# Patient Record
Sex: Female | Born: 1970 | Race: Black or African American | Hispanic: No | Smoking: Never smoker
Health system: Southern US, Community
[De-identification: ages and names within clinical notes are randomized; demographics above are authoritative.]

## PROBLEM LIST (undated history)

## (undated) DIAGNOSIS — E611 Iron deficiency: Secondary | ICD-10-CM

## (undated) DIAGNOSIS — I10 Essential (primary) hypertension: Secondary | ICD-10-CM

## (undated) DIAGNOSIS — E669 Obesity, unspecified: Secondary | ICD-10-CM

## (undated) HISTORY — DX: Essential (primary) hypertension: I10

## (undated) HISTORY — PX: UTERINE FIBROID SURGERY: SHX826

## (undated) HISTORY — DX: Obesity, unspecified: E66.9

---

## 2019-05-31 DIAGNOSIS — I4891 Unspecified atrial fibrillation: Secondary | ICD-10-CM

## 2019-05-31 HISTORY — DX: Unspecified atrial fibrillation: I48.91

## 2019-12-19 ENCOUNTER — Other Ambulatory Visit: Payer: Self-pay

## 2019-12-19 ENCOUNTER — Encounter: Payer: Self-pay | Admitting: Internal Medicine

## 2019-12-19 ENCOUNTER — Ambulatory Visit: Payer: Self-pay | Admitting: Internal Medicine

## 2019-12-19 VITALS — BP 138/80 | HR 72 | Resp 14 | Ht 64.0 in | Wt 286.0 lb

## 2019-12-19 DIAGNOSIS — Z79899 Other long term (current) drug therapy: Secondary | ICD-10-CM

## 2019-12-19 DIAGNOSIS — Z6841 Body Mass Index (BMI) 40.0 and over, adult: Secondary | ICD-10-CM

## 2019-12-19 DIAGNOSIS — I480 Paroxysmal atrial fibrillation: Secondary | ICD-10-CM

## 2019-12-19 DIAGNOSIS — E669 Obesity, unspecified: Secondary | ICD-10-CM | POA: Insufficient documentation

## 2019-12-19 DIAGNOSIS — I10 Essential (primary) hypertension: Secondary | ICD-10-CM

## 2019-12-19 DIAGNOSIS — R609 Edema, unspecified: Secondary | ICD-10-CM

## 2019-12-19 MED ORDER — FLECAINIDE ACETATE 100 MG PO TABS: 100.0000 mg | ORAL_TABLET | Freq: Two times a day (BID) | ORAL | 3 refills | Status: DC

## 2019-12-19 MED ORDER — FUROSEMIDE 20 MG PO TABS: ORAL_TABLET | ORAL | 0 refills | Status: DC

## 2019-12-19 MED ORDER — METOPROLOL SUCCINATE ER 50 MG PO TB24: 50.0000 mg | ORAL_TABLET | Freq: Every day | ORAL | 3 refills | Status: AC

## 2019-12-19 NOTE — Progress Notes (Addendum)
Subjective:    Patient ID: Natalie Manning, female   DOB: Jan 12, 1971, 49 y.o.   MRN: [id number]   HPI   Here to establish  Moved here in November from [place].   She is needing med refilled.    1. Reportedly one episode of paroxysmal atrial fibrillation with rapid ventricular rate:  Occurred December 2019.  States taking Flecainide for this.  She states her work up was normal--they never found a reason for her atrial fib.  She thought they would ultimately take her off the Flecainide, but then COVID hit and she was never able to get back .   Apparently, she was offered ablation, but did not understand why she would want that done for a one time event.   May have had 6 follow ups total.  2.  Hypertension:  Diagnosed years prior to afib, but was able to come off medication at one point.  She redeveloped elevated bp and was placed back on medication.    3.  Applying for disability.  Also applying again for Medicaid.    4.  Bilateral ankle and foot swelling for about 2 weeks, then went down.  Started back up 3 days ago.  Sits with her legs dependent when sitting inside.  Does elevate feet when outside.   Eats a lot of carbs and pretzels. No chest pain or dyspnea.     Current Meds  Medication Sig  . aspirin 325 MG tablet Take 325 mg by mouth daily.  . flecainide (TAMBOCOR) 100 MG tablet Take 100 mg by mouth 2 (two) times daily.  . metoprolol succinate (TOPROL-XL) 50 MG 24 hr tablet Take 50 mg by mouth daily. Take with or immediately following a meal.   No Known Allergies   Past Medical History:  Diagnosis Date  . Atrial fibrillation ([place]) 05/2019  . Hypertension   . Iron deficiency    a. pt recalls previously having to have iron transfusions.  . Obesity     Past Surgical History:  Procedure Laterality Date  . UTERINE FIBROID SURGERY      Family History  Problem Relation Age of Onset  . Diabetes Mother   . Hypertension Mother   . Hypertension Sister   .  Obesity Sister        had gastric sleeve surgery.  Marland Kitchen CAD Neg Hx     Social History   Socioeconomic History  . Marital status: Legally Separated    Spouse name: Not on file  . Number of children: 1  . Years of education: Not on file  . Highest education level: Not on file  Occupational History  . Not on file  Tobacco Use  . Smoking status: Never Smoker  . Smokeless tobacco: Never Used  Vaping Use  . Vaping Use: Never used  Substance and Sexual Activity  . Alcohol use: Never  . Drug use: Never  . Sexual activity: Yes    Birth control/protection: None  Other Topics Concern  . Not on file  Social History Narrative   Moved to [place] in Nov 2020 for a job transfer with [employer], then they backed out of the transfer deal.  She is taking a year or two off to decide what to do.   Lives here mainly by herself, but son joins her at times.   Social Determinants of Health   Financial Resource Strain: Medium Risk  . Difficulty of Paying Living Expenses: Somewhat hard  Food Insecurity: No Food Insecurity  .  Worried About Programme researcher, broadcasting/film/video in the Last Year: Never true  . Ran Out of Food in the Last Year: Never true  Transportation Needs: No Transportation Needs  . Lack of Transportation (Medical): No  . Lack of Transportation (Non-Medical): No  Physical Activity:   . Days of Exercise per Week:   . Minutes of Exercise per Session:   Stress: No Stress Concern Present  . Feeling of Stress : Not at all  Social Connections: Socially Isolated  . Frequency of Communication with Friends and Family: Once a week  . Frequency of Social Gatherings with Friends and Family: Never  . Attends Religious Services: Never  . Active Member of Clubs or Organizations: No  . Attends Banker Meetings: Never  . Marital Status: Separated  Intimate Partner Violence: Not At Risk  . Fear of Current or Ex-Partner: No  . Emotionally Abused: No  . Physically Abused: No  . Sexually Abused: No        Review of Systems    Objective:   BP 138/80 (BP Location: Left Arm, Patient Position: Sitting, Cuff Size: Large)   Pulse 72   Resp 14   Ht 5\' 4"  (1.626 m)   Wt 286 lb (129.7 kg)   LMP 11/14/2019   BMI 49.09 kg/m   Physical Exam  NAD HEENT:  PERRL, EOMI, TMs pearly gray. Neck:  Supple, No adenopathy, no thyromegaly Chest:  CTA CV:  RRR with normal S1 and S2, No S3, S4 or murmur.  No carotid bruit.  Carotid, radial, DP and PT pulses normal and equal.  No JVD Abd:  S, NT, No HSM or mass, + BS LE:  Trace edema of feet/ankles.   Assessment & Plan  1.  History of Paroxysmal Atrial Fibrillation:  On Flecainide for this per patient.  Suspect more to her rhythm issues than just a one time episode of afib, but with her move and COVID 19 pandemic limiting follow up, perhaps she is correct.  Sending for records.   Referral to Cardiology for their opinion on this and long term use of Flecainide.  2.  Hypertension:  Fair control with Metoprolol XL.  3.  Mild peripheral edema:  Encouraged decrease in sodium intake and how to calculate.  Elevate legs when sitting and recline.  Discussed weight loss as well.  Furosemide 20 mg daily for 3 days, then only as needed.  Check CBC, CMP.  4.  Obesity:  Discussed lifestyle changes.  5.  Needs utility bill support:  Currently receiving some help with [facility].  Information for 11/16/2019 for [employer] support.  6.  Encouraged COVID vaccination.

## 2019-12-19 NOTE — Patient Instructions (Addendum)
[employer]  . Address: [facility], [address] [place], [place], [place] [address]  . Phone: [phone]  Hours: Open  Closes 4:30   Drink a glass of water before every meal Drink 6-8 glasses of water daily Eat three meals daily Eat a protein and healthy fat with every meal (eggs,fish, chicken, [place] and limit red meats) Eat 5 servings of vegetables daily, mix the colors Eat 2 servings of fruit daily with skin, if skin is edible Use smaller plates Put food/utensils down as you chew and swallow each bite Eat at a table with friends/family at least once daily, no TV Do not eat in front of the TV  Recent studies show that people who consume all of their calories in a 12 hour period lose weight more efficiently.  For example, if you eat your first meal at 7:00 a.m., your last meal of the day should be completed by 7:00 p.m.

## 2019-12-19 NOTE — Progress Notes (Signed)
Social worker met with new patient who is scheduled with Dr. Amil Amen for medical visit. Social worker completed New Patient Questionnaire which included completion of housing, intimate partner violence, transportation needs, stress, Emergency planning/management officer strain, food insecurity and screeners. Social History   Socioeconomic History  . Marital status: Legally Separated    Spouse name: Not on file  . Number of children: 1  . Years of education: Not on file  . Highest education level: Not on file  Occupational History  . Not on file  Tobacco Use  . Smoking status: Never Smoker  . Smokeless tobacco: Never Used  Vaping Use  . Vaping Use: Never used  Substance and Sexual Activity  . Alcohol use: Never  . Drug use: Never  . Sexual activity: Yes    Birth control/protection: None  Other Topics Concern  . Not on file  Social History Narrative   Moved to [place] in Nov 2020 for a job transfer with [employer], then they backed out of the transfer deal.  She is taking a year or two off to decide what to do.   Lives here mainly by herself, but son joins her at times.   Social Determinants of Health   Financial Resource Strain: Medium Risk  . Difficulty of Paying Living Expenses: Somewhat hard  Food Insecurity: No Food Insecurity  . Worried About Charity fundraiser in the Last Year: Never true  . Ran Out of Food in the Last Year: Never true  Transportation Needs: No Transportation Needs  . Lack of Transportation (Medical): No  . Lack of Transportation (Non-Medical): No  Stress: No Stress Concern Present  . Feeling of Stress : Not at all  Social Connections: Socially Isolated  . Frequency of Communication with Friends and Family: Once a week  . Frequency of Social Gatherings with Friends and Family: Never  . Attends Religious Services: Never  . Active Member of Clubs or Organizations: No  . Attends Archivist Meetings: Never  . Marital Status: Separated       GAD 7 :  Generalized Anxiety Score 12/19/2019  Nervous, Anxious, on Edge 0  Control/stop worrying 0  Worry too much - different things 0  Trouble relaxing 0  Restless 0  Easily annoyed or irritable 0  Afraid - awful might happen 0  Total GAD 7 Score 0  Anxiety Difficulty Not difficult at all     Based on presentation none at this time. Patient would like information to help with water bill although it is up to date but would like assistance.   Social worker provided emergency crisis flyer with crisis providers if ever needed.

## 2020-01-11 ENCOUNTER — Ambulatory Visit: Payer: Self-pay | Admitting: Cardiology

## 2020-01-11 DIAGNOSIS — D509 Iron deficiency anemia, unspecified: Secondary | ICD-10-CM | POA: Diagnosis present

## 2020-01-11 DIAGNOSIS — Z20822 Contact with and (suspected) exposure to covid-19: Secondary | ICD-10-CM | POA: Diagnosis present

## 2020-01-11 DIAGNOSIS — R1900 Intra-abdominal and pelvic swelling, mass and lump, unspecified site: Secondary | ICD-10-CM | POA: Diagnosis present

## 2020-01-11 DIAGNOSIS — J9601 Acute respiratory failure with hypoxia: Secondary | ICD-10-CM | POA: Diagnosis present

## 2020-01-11 DIAGNOSIS — I10 Essential (primary) hypertension: Secondary | ICD-10-CM | POA: Diagnosis present

## 2020-01-11 DIAGNOSIS — Z79899 Other long term (current) drug therapy: Secondary | ICD-10-CM

## 2020-01-11 DIAGNOSIS — Z6841 Body Mass Index (BMI) 40.0 and over, adult: Secondary | ICD-10-CM

## 2020-01-11 DIAGNOSIS — J90 Pleural effusion, not elsewhere classified: Principal | ICD-10-CM | POA: Diagnosis present

## 2020-01-11 DIAGNOSIS — E669 Obesity, unspecified: Secondary | ICD-10-CM | POA: Diagnosis present

## 2020-01-11 DIAGNOSIS — Z8249 Family history of ischemic heart disease and other diseases of the circulatory system: Secondary | ICD-10-CM

## 2020-01-11 DIAGNOSIS — I4891 Unspecified atrial fibrillation: Secondary | ICD-10-CM | POA: Diagnosis present

## 2020-01-11 DIAGNOSIS — R600 Localized edema: Secondary | ICD-10-CM | POA: Diagnosis present

## 2020-01-11 DIAGNOSIS — Z7982 Long term (current) use of aspirin: Secondary | ICD-10-CM

## 2020-01-11 MED ORDER — SODIUM CHLORIDE 0.9% FLUSH: 3.0000 mL | Freq: Once | INTRAVENOUS | Status: DC

## 2020-01-12 ENCOUNTER — Observation Stay (HOSPITAL_BASED_OUTPATIENT_CLINIC_OR_DEPARTMENT_OTHER): Payer: Self-pay

## 2020-01-12 ENCOUNTER — Other Ambulatory Visit: Payer: Self-pay | Admitting: Acute Care

## 2020-01-12 ENCOUNTER — Observation Stay (HOSPITAL_COMMUNITY): Payer: Self-pay

## 2020-01-12 ENCOUNTER — Encounter (HOSPITAL_COMMUNITY): Payer: Self-pay | Admitting: Emergency Medicine

## 2020-01-12 ENCOUNTER — Emergency Department (HOSPITAL_COMMUNITY): Payer: Self-pay

## 2020-01-12 ENCOUNTER — Other Ambulatory Visit: Payer: Self-pay

## 2020-01-12 ENCOUNTER — Inpatient Hospital Stay (HOSPITAL_COMMUNITY)
Admission: EM | Admit: 2020-01-12 | Discharge: 2020-01-13 | DRG: 186 | Disposition: A | Discharge status: Home / Self Care | Payer: Self-pay | Attending: Internal Medicine | Admitting: Internal Medicine

## 2020-01-12 DIAGNOSIS — Z9889 Other specified postprocedural states: Secondary | ICD-10-CM

## 2020-01-12 DIAGNOSIS — D649 Anemia, unspecified: Secondary | ICD-10-CM | POA: Diagnosis present

## 2020-01-12 DIAGNOSIS — J9601 Acute respiratory failure with hypoxia: Secondary | ICD-10-CM

## 2020-01-12 DIAGNOSIS — I480 Paroxysmal atrial fibrillation: Secondary | ICD-10-CM

## 2020-01-12 DIAGNOSIS — I4891 Unspecified atrial fibrillation: Secondary | ICD-10-CM | POA: Diagnosis present

## 2020-01-12 DIAGNOSIS — J90 Pleural effusion, not elsewhere classified: Principal | ICD-10-CM

## 2020-01-12 DIAGNOSIS — M7989 Other specified soft tissue disorders: Secondary | ICD-10-CM

## 2020-01-12 DIAGNOSIS — R110 Nausea: Secondary | ICD-10-CM

## 2020-01-12 DIAGNOSIS — R778 Other specified abnormalities of plasma proteins: Secondary | ICD-10-CM

## 2020-01-12 DIAGNOSIS — R0602 Shortness of breath: Secondary | ICD-10-CM

## 2020-01-12 HISTORY — DX: Iron deficiency: E61.1

## 2020-01-12 LAB — BASIC METABOLIC PANEL
Anion gap: 12 (ref 5–15)
BUN: 16 mg/dL (ref 6–20)
CO2: 23 mmol/L (ref 22–32)
Calcium: 9.3 mg/dL (ref 8.9–10.3)
Chloride: 105 mmol/L (ref 98–111)
Creatinine, Ser: 0.89 mg/dL (ref 0.44–1.00)
GFR calc Af Amer: >60 mL/min (ref >60)
GFR calc non Af Amer: >60 mL/min (ref >60)
Glucose, Bld: 113 mg/dL — ABNORMAL HIGH (ref 70–99)
Potassium: 3.7 mmol/L (ref 3.5–5.1)
Sodium: 140 mmol/L (ref 135–145)

## 2020-01-12 LAB — HIV ANTIBODY (ROUTINE TESTING W REFLEX): HIV Screen 4th Generation wRfx: NONREACTIVE

## 2020-01-12 LAB — HEPATIC FUNCTION PANEL
ALT: 14 U/L (ref 0–44)
AST: 21 U/L (ref 15–41)
Albumin: 3.9 g/dL (ref 3.5–5.0)
Alkaline Phosphatase: 46 U/L (ref 38–126)
Bilirubin, Direct: <0.1 mg/dL (ref 0.0–0.2)
Total Bilirubin: 0.5 mg/dL (ref 0.3–1.2)
Total Protein: 8 g/dL (ref 6.5–8.1)

## 2020-01-12 LAB — LACTATE DEHYDROGENASE: LDH: 309 U/L — ABNORMAL HIGH (ref 98–192)

## 2020-01-12 LAB — PROTEIN, TOTAL: Total Protein: 8.3 g/dL — ABNORMAL HIGH (ref 6.5–8.1)

## 2020-01-12 LAB — BODY FLUID CELL COUNT WITH DIFFERENTIAL
Lymphs, Fluid: 79 %
Monocyte-Macrophage-Serous Fluid: 10 % — ABNORMAL LOW (ref 50–90)
Neutrophil Count, Fluid: 11 % (ref 0–25)
Total Nucleated Cell Count, Fluid: 5957 cu mm — ABNORMAL HIGH (ref 0–1000)

## 2020-01-12 LAB — URINALYSIS, ROUTINE W REFLEX MICROSCOPIC
Bilirubin Urine: NEGATIVE
Glucose, UA: NEGATIVE mg/dL
Ketones, ur: NEGATIVE mg/dL
Leukocytes,Ua: NEGATIVE
Nitrite: NEGATIVE
Protein, ur: NEGATIVE mg/dL
Specific Gravity, Urine: 1.01 (ref 1.005–1.030)
pH: 5 (ref 5.0–8.0)

## 2020-01-12 LAB — IRON AND TIBC
Iron: 16 ug/dL — ABNORMAL LOW (ref 28–170)
Saturation Ratios: 4 % — ABNORMAL LOW (ref 10.4–31.8)
TIBC: 425 ug/dL (ref 250–450)
UIBC: 409 ug/dL

## 2020-01-12 LAB — CBC
HCT: 34.4 % — ABNORMAL LOW (ref 36.0–46.0)
Hemoglobin: 10.1 g/dL — ABNORMAL LOW (ref 12.0–15.0)
MCH: 22 pg — ABNORMAL LOW (ref 26.0–34.0)
MCHC: 29.4 g/dL — ABNORMAL LOW (ref 30.0–36.0)
MCV: 74.9 fL — ABNORMAL LOW (ref 80.0–100.0)
Platelets: 224 10*3/uL (ref 150–400)
RBC: 4.59 MIL/uL (ref 3.87–5.11)
RDW: 20 % — ABNORMAL HIGH (ref 11.5–15.5)
WBC: 12 10*3/uL — ABNORMAL HIGH (ref 4.0–10.5)
nRBC: 0 % (ref 0.0–0.2)

## 2020-01-12 LAB — LIPASE, BLOOD: Lipase: 30 U/L (ref 11–51)

## 2020-01-12 LAB — ECHOCARDIOGRAM COMPLETE

## 2020-01-12 LAB — I-STAT BETA HCG BLOOD, ED (NOT ORDERABLE): I-stat hCG, quantitative: <5 m[IU]/mL (ref <5)

## 2020-01-12 LAB — PROTIME-INR
INR: 1.5 — ABNORMAL HIGH (ref 0.8–1.2)
Prothrombin Time: 17.3 seconds — ABNORMAL HIGH (ref 11.4–15.2)

## 2020-01-12 LAB — PROCALCITONIN: Procalcitonin: <0.1 ng/mL

## 2020-01-12 LAB — TROPONIN I (HIGH SENSITIVITY)
Troponin I (High Sensitivity): 147 ng/L (ref <18)
Troponin I (High Sensitivity): 168 ng/L (ref <18)
Troponin I (High Sensitivity): 231 ng/L (ref <18)

## 2020-01-12 LAB — LACTATE DEHYDROGENASE, PLEURAL OR PERITONEAL FLUID: LD, Fluid: 1914 U/L — ABNORMAL HIGH (ref 3–23)

## 2020-01-12 LAB — RETICULOCYTES
Immature Retic Fract: 31.3 % — ABNORMAL HIGH (ref 2.3–15.9)
RBC.: 4.46 MIL/uL (ref 3.87–5.11)
Retic Count, Absolute: 86.5 10*3/uL (ref 19.0–186.0)
Retic Ct Pct: 1.9 % (ref 0.4–3.1)

## 2020-01-12 LAB — VITAMIN B12: Vitamin B-12: 203 pg/mL (ref 180–914)

## 2020-01-12 LAB — BRAIN NATRIURETIC PEPTIDE: B Natriuretic Peptide: 47.3 pg/mL (ref 0.0–100.0)

## 2020-01-12 LAB — FERRITIN: Ferritin: 13 ng/mL (ref 11–307)

## 2020-01-12 LAB — FOLATE: Folate: 10.9 ng/mL (ref >5.9)

## 2020-01-12 LAB — MAGNESIUM: Magnesium: 2.1 mg/dL (ref 1.7–2.4)

## 2020-01-12 LAB — SARS CORONAVIRUS 2 BY RT PCR (HOSPITAL ORDER, PERFORMED IN ~~LOC~~ HOSPITAL LAB): SARS Coronavirus 2: NEGATIVE

## 2020-01-12 LAB — PROTEIN, PLEURAL OR PERITONEAL FLUID: Total protein, fluid: 7 g/dL

## 2020-01-12 LAB — TSH: TSH: 2.483 u[IU]/mL (ref 0.350–4.500)

## 2020-01-12 LAB — CHOLESTEROL, TOTAL: Cholesterol: 200 mg/dL (ref 0–200)

## 2020-01-12 MED ORDER — SODIUM CHLORIDE (PF) 0.9 % IJ SOLN
INTRAMUSCULAR | Status: AC
  Filled 2020-01-12: qty 50

## 2020-01-12 MED ORDER — SODIUM CHLORIDE 0.9 % IV SOLN: Freq: Once | INTRAVENOUS | Status: DC

## 2020-01-12 MED ORDER — ASPIRIN 325 MG PO TABS
325.0000 mg | ORAL_TABLET | Freq: Every day | ORAL | Status: DC
  Administered 2020-01-12 – 2020-01-13 (×2): 325 mg via ORAL
  Filled 2020-01-12 (×2): qty 1

## 2020-01-12 MED ORDER — ONDANSETRON HCL 4 MG PO TABS: 4.0000 mg | ORAL_TABLET | Freq: Four times a day (QID) | ORAL | Status: DC | PRN

## 2020-01-12 MED ORDER — IOHEXOL 350 MG/ML SOLN
100.0000 mL | Freq: Once | INTRAVENOUS | Status: AC | PRN
  Administered 2020-01-12: 100 mL via INTRAVENOUS

## 2020-01-12 MED ORDER — METOPROLOL SUCCINATE ER 50 MG PO TB24
50.0000 mg | ORAL_TABLET | Freq: Every day | ORAL | Status: DC
  Administered 2020-01-12 – 2020-01-13 (×2): 50 mg via ORAL
  Filled 2020-01-12 (×2): qty 1

## 2020-01-12 MED ORDER — FUROSEMIDE 10 MG/ML IJ SOLN
40.0000 mg | Freq: Two times a day (BID) | INTRAMUSCULAR | Status: DC
  Administered 2020-01-12 – 2020-01-13 (×2): 40 mg via INTRAVENOUS
  Filled 2020-01-12 (×3): qty 4

## 2020-01-12 MED ORDER — ONDANSETRON HCL 4 MG/2ML IJ SOLN: 4.0000 mg | Freq: Four times a day (QID) | INTRAMUSCULAR | Status: DC | PRN

## 2020-01-12 MED ORDER — ACETAMINOPHEN 325 MG PO TABS
650.0000 mg | ORAL_TABLET | Freq: Four times a day (QID) | ORAL | Status: DC | PRN
  Administered 2020-01-12: 650 mg via ORAL
  Filled 2020-01-12: qty 2

## 2020-01-12 MED ORDER — FLECAINIDE ACETATE 100 MG PO TABS
100.0000 mg | ORAL_TABLET | Freq: Two times a day (BID) | ORAL | Status: DC
  Filled 2020-01-12: qty 1

## 2020-01-12 MED ORDER — FUROSEMIDE 10 MG/ML IJ SOLN: 40.0000 mg | Freq: Four times a day (QID) | INTRAMUSCULAR | Status: DC

## 2020-01-12 NOTE — ED Triage Notes (Signed)
Patient had second covid shot on Monday. Patient states she has been throwing up, diarrhea, and a severe headache.

## 2020-01-12 NOTE — Progress Notes (Signed)
Left lower extremity venous duplex completed. Refer to "CV Proc" under chart review to view preliminary results.  01/12/2020 4:27 PM Eula Fried., MHA, RVT, RDCS, RDMS

## 2020-01-12 NOTE — Consult Note (Signed)
NAME:  Natalie Manning, MRN:  [id number], DOB:  1971-04-25, LOS: 0 ADMISSION DATE:  01/12/2020, CONSULTATION DATE:  7/15 REFERRING MD:  Toniann Fail, CHIEF COMPLAINT:  Acute respiratory failure and pleural effusion   Brief History   49 year old female admitted 7/15 w/ cc: shortness of breath. Initially being worked up for possible PE given L>R LE edema. CT was neg for PE BUT did show large right pleural effusion w/ associated consolidative changes. Was also hypoxic so PCCM asked to assist w/ further evaluation.   History of present illness   49 year old female patient admitted on 7/15 with chief complaint of shortness of breath and nausea. History of atrial fibrillation diagnosed back in 2019.  Had been seen recently about 2 weeks prior to presentation by her primary care provider with chief complaint of bilateral lower extremity edema for which she was started on Lasix.  Of note she had had her COVID-19 vaccination on 7/11 this was subsequently followed by nausea, vomiting, and diarrhea which subsided the next day.  She presented to the emergency room with worsening shortness of breath complaint.  Denied fever, chills, chest pain, abdominal pain.  Revealed lower extremity edema had improved on the right side but not left. In the emergency room BNP was negative.  Had mild troponin elevation.  CT chest was obtained this was negative for central pulmonary emboli but did show right-sided pleural effusion.  Raised ? Of small LUL PE. She was hypoxic.  Ultrasounds were ordered to evaluate lower extremity edema, echocardiogram was ordered, cardiology consulted.  Pulmonary has been asked to evaluate for dyspnea and new right pleural effusion.  Past Medical History  Atrial fibrillation on flecainide and metoprolol, CHA2DS2-VASc score 2.  On aspirin Recent lower extremity edema. Hypertension Obesity  Significant Hospital Events   7/15 admitted  Consults:  pulm  Procedures:  Right diagnostic and  therapeutic thora 7/15 (pending)  Significant Diagnostic Tests:  CT chest 7/15: 1. Very limited study due to respiratory motion artifact. No large central pulmonary artery embolus. Artifact versus small nonocclusive age indeterminate peripheral pulmonary artery emboli. No CT evidence of right heart straining. 2. Large right pleural effusion with complete consolidative changes of the right lower lobe which may represent atelectasis or infiltrate. Underlying mass is not excluded. 3. Diffuse interstitial prominence may represent edema. 4. Mildly enlarged right cardiophrenic lymph nodes, likely reactive. ECHO 7/15>>> LE [place] 7/15>>> Micro Data:  Pleural fluid 7/15>>>  Antimicrobials:    Interim history/subjective:  Feels better   Objective   Blood pressure (Abnormal) 146/83, pulse 90, temperature 98.7 F (37.1 C), temperature source Oral, resp. rate 18, height 5\' 6"  (1.676 m), weight 130.4 kg, SpO2 98 %.       No intake or output data in the 24 hours ending 01/12/20 1035 Filed Weights   01/11/20 2337 01/12/20 1003  Weight: 131.5 kg 130.4 kg    Examination: General: pleasant 49 year old female resting in chair HENT: NCAT MMM No JVD Lungs: decreased on right no accessory use on room air  Cardiovascular: RRR Abdomen: soft not tender  Extremities: warm and dry LLE a little more swollen Neuro: awake and oriented  GU: voids   Resolved Hospital Problem list     Assessment & Plan:  Dyspnea  Large right pleural effusion Right lower lobe atelectasis  LE swelling Atrial fib Pelvic mass  Multifactorial dyspnea Multifactorial: large right effusion w/ associated consolidated RLL (favor atelectasis but mass not excluded). Also has element of what looks like  pulmonary edema.  -CT imaging raising concern for possible filling deficit in LUL not clear if this is motion artifact of small emboli. If it is emboli it is not why she is hypoxic or SOB. Still however reasonable to repeat imaging  as has had sig travel history and swelling in LEs started after last trip to [facility] about 3 weeks ago.  -I agree Not clear if there is mass in this area of consolidation. In setting of also having large pelvic mass wonder about malignancy.  Plan Will go ahead and do therapeutic and diagnostic thora today. Send for infection and malignancy evaluation Await echo and LE [place] (r/o DVT) Hold off on systemic ac from PE stand-point for now (will ask Dr Wynona Neat to review films).  If she has to go for CT of abd/pelvis we could repeat CT (angio) imaging of chest s/p thora as well to better evaluate the RLL consolidation and r/o mass once fluid no longer compressing it and also try to get a better quality film to r/o PE.      Best practice:  Per primary   Labs   CBC: Recent Labs  Lab 01/11/20 2354  WBC 12.0*  HGB 10.1*  HCT 34.4*  MCV 74.9*  PLT 224    Basic Metabolic Panel: Recent Labs  Lab 01/11/20 2354 01/12/20 0625  NA 140  --   K 3.7  --   CL 105  --   CO2 23  --   GLUCOSE 113*  --   BUN 16  --   CREATININE 0.89  --   CALCIUM 9.3  --   MG  --  2.1   GFR: Estimated Creatinine Clearance: 103.5 mL/min (by C-G formula based on SCr of 0.89 mg/dL). Recent Labs  Lab 01/11/20 2354 01/12/20 0625  PROCALCITON  --  <0.10  WBC 12.0*  --     Liver Function Tests: Recent Labs  Lab 01/12/20 0625  AST 21  ALT 14  ALKPHOS 46  BILITOT 0.5  PROT 8.0  ALBUMIN 3.9   Recent Labs  Lab 01/11/20 2354  LIPASE 30   No results for input(s): AMMONIA in the last 168 hours.  ABG No results found for: PHART, PCO2ART, PO2ART, HCO3, TCO2, ACIDBASEDEF, O2SAT   Coagulation Profile: Recent Labs  Lab 01/12/20 0625  INR 1.5*    Cardiac Enzymes: No results for input(s): CKTOTAL, CKMB, CKMBINDEX, TROPONINI in the last 168 hours.  HbA1C: No results found for: HGBA1C  CBG: No results for input(s): GLUCAP in the last 168 hours.  Review of Systems:   Review of Systems    Constitutional: Positive for fever. Negative for chills and malaise/fatigue.  HENT: Negative.   Eyes: Negative.   Respiratory: Positive for cough and shortness of breath.   Cardiovascular: Positive for leg swelling.  Gastrointestinal: Negative.   Genitourinary: Negative.   Musculoskeletal: Negative.   Skin: Negative.   Neurological: Negative.   Endo/Heme/Allergies: Negative.   Psychiatric/Behavioral: Negative.      Past Medical History  She,  has a past medical history of Atrial fibrillation ([facility]) (05/2019), Hypertension, Iron deficiency, and Obesity.   Surgical History    Past Surgical History:  Procedure Laterality Date   UTERINE FIBROID SURGERY       Social History   reports that she has never smoked. She has never used smokeless tobacco. She reports that she does not drink alcohol and does not use drugs.   Family History   Her family history includes Diabetes  in her mother; Hypertension in her mother and sister; Obesity in her sister. There is no history of CAD.   Allergies No Known Allergies   Home Medications  Prior to Admission medications   Medication Sig Start Date End Date Taking? Authorizing Provider  aspirin 325 MG tablet Take 325 mg by mouth daily.   Yes [provider]  flecainide (TAMBOCOR) 100 MG tablet Take 1 tablet (100 mg total) by mouth 2 (two) times daily. 12/19/19  Yes Julieanne Manson, MD  furosemide (LASIX) 20 MG tablet 1 tab by mouth in morning for 2 days and then as needed for edema 12/19/19  Yes Julieanne Manson, MD  metoprolol succinate (TOPROL-XL) 50 MG 24 hr tablet Take 1 tablet (50 mg total) by mouth daily. Take with or immediately following a meal. 12/19/19  Yes Julieanne Manson, MD     Critical care time: na    Simonne Martinet ACNP-BC [facility] Pulmonary/Critical Care Pager # [phone] OR # [phone] if no answer

## 2020-01-12 NOTE — ED Notes (Signed)
ED Provider at bedside. 

## 2020-01-12 NOTE — Progress Notes (Addendum)
Reviewed QT interval on telemetry given it was vague on EKG - more clearly defined on telemetry with QTc of .   Dr. Mayford Knife has signed consult note - see her note. I also reviewed EKG further with her to determine her thoughts on if delta wave was present. She did not feel this was the case. She originally recommended to f/u afib clinic but she actually already had an appt pending to establish care with Dr. Herbie Baltimore on 8/12. Per our discussion, due to appt availability Dr. Mayford Knife recommends to keep this appointment. Hold off on afib clinic referral unless recurrent episodes arise. Appt info reiterated on AVS.  Lynnley Doddridge PA-C

## 2020-01-12 NOTE — Progress Notes (Signed)
  Echocardiogram 2D Echocardiogram has been performed.  Syretta Kochel G Janita Camberos 01/12/2020, 9:21 AM

## 2020-01-12 NOTE — H&P (Signed)
History and Physical    [name] TKP:[id number] DOB: [birth year] DOA: 01/12/2020  PCP: Julieanne Manson, MD  Patient coming from: Home.  Chief Complaint: Shortness of breath and nausea.  HPI: Natalie Manning is a 49 y.o. female with history of A. fib diagnosed in 2019 since then patient has been on flecainide and metoprolol has been recently experiencing bilateral lower extremity edema about 2 weeks ago when patient's primary care physician placed patient on Lasix.  About 4 days ago patient had COVID-19 vaccination following which 4 hours later patient developed nausea vomiting and diarrhea.  This resolved the following day.  Then yesterday patient started having nausea and shortness of breath with minimal exertion.  Denies chest pain fever chills abdominal pain.  Patient is lower extremity edema improved with Lasix but left lower extremity swelling persisted.  ED Course: In the ER patient was afebrile.  Gets short of breath on exertion.  EKG shows normal sinus rhythm.  High sensitive troponin was 231 and 168 and BNP was 47.3.  WBC count was 12 hemoglobin 10.1.  No old labs to compare.  Patient had a CT angiogram of the chest which was not showing any definite pulmonary embolism but showing large right-sided pleural effusion for which radiologist recommended thoracentesis.  On exam patient has left lower extremity edema extending up to the knees.  Abdomen appears benign on exam.  Patient admitted for acute respiratory failure with hypoxia with large right-sided pleural effusion.  Review of Systems: As per HPI, rest all negative.   Past Medical History:  Diagnosis Date  . Atrial fibrillation (HCC) 05/2019  . Dysrhythmia   . Hypertension   . Obesity     Past Surgical History:  Procedure Laterality Date  . UTERINE FIBROID SURGERY       reports that she has never smoked. She has never used smokeless tobacco. She reports that she does not drink alcohol and does not  use drugs.  No Known Allergies  Family History  Problem Relation Age of Onset  . Diabetes Mother   . Hypertension Mother   . Hypertension Sister   . Obesity Sister        had gastric sleeve surgery.    Prior to Admission medications   Medication Sig Start Date End Date Taking? Authorizing Provider  aspirin 325 MG tablet Take 325 mg by mouth daily.   Yes [provider]  flecainide (TAMBOCOR) 100 MG tablet Take 1 tablet (100 mg total) by mouth 2 (two) times daily. 12/19/19  Yes Julieanne Manson, MD  furosemide (LASIX) 20 MG tablet 1 tab by mouth in morning for 2 days and then as needed for edema 12/19/19  Yes Julieanne Manson, MD  metoprolol succinate (TOPROL-XL) 50 MG 24 hr tablet Take 1 tablet (50 mg total) by mouth daily. Take with or immediately following a meal. 12/19/19  Yes Julieanne Manson, MD    Physical Exam: Constitutional: Moderately built and nourished. Vitals:   01/12/20 0130 01/12/20 0348 01/12/20 0400 01/12/20 0519  BP: (!) 144/82 (!) 148/91 (!) 148/89 (!) 151/91  Pulse: 84 83 84 89  Resp: 18 (!) 22 19 (!) 27  Temp:      TempSrc:      SpO2: 97% 92% 93% 94%  Weight:      Height:       Eyes: Anicteric no pallor. ENMT: No discharge from the ears eyes nose or mouth. Neck: No mass felt.  No neck rigidity. Respiratory: No rhonchi or crepitations.  Cardiovascular: S1-S2 heard. Abdomen: Soft nontender bowel sounds present. Musculoskeletal: Left lower extremity edema present. Skin: No rash. Neurologic: Alert awake oriented to time place and person.  Moves all extremities. Psychiatric: Appears normal.  Normal affect.   Labs on Admission: I have personally reviewed following labs and imaging studies  CBC: Recent Labs  Lab 01/11/20 2354  WBC 12.0*  HGB 10.1*  HCT 34.4*  MCV 74.9*  PLT 224   Basic Metabolic Panel: Recent Labs  Lab 01/11/20 2354  NA 140  K 3.7  CL 105  CO2 23  GLUCOSE 113*  BUN 16  CREATININE 0.89  CALCIUM 9.3    GFR: Estimated Creatinine Clearance: 104.1 mL/min (by C-G formula based on SCr of 0.89 mg/dL). Liver Function Tests: No results for input(s): AST, ALT, ALKPHOS, BILITOT, PROT, ALBUMIN in the last 168 hours. Recent Labs  Lab 01/11/20 2354  LIPASE 30   No results for input(s): AMMONIA in the last 168 hours. Coagulation Profile: No results for input(s): INR, PROTIME in the last 168 hours. Cardiac Enzymes: No results for input(s): CKTOTAL, CKMB, CKMBINDEX, TROPONINI in the last 168 hours. BNP (last 3 results) No results for input(s): PROBNP in the last 8760 hours. HbA1C: No results for input(s): HGBA1C in the last 72 hours. CBG: No results for input(s): GLUCAP in the last 168 hours. Lipid Profile: No results for input(s): CHOL, HDL, LDLCALC, TRIG, CHOLHDL, LDLDIRECT in the last 72 hours. Thyroid Function Tests: No results for input(s): TSH, T4TOTAL, FREET4, T3FREE, THYROIDAB in the last 72 hours. Anemia Panel: No results for input(s): VITAMINB12, FOLATE, FERRITIN, TIBC, IRON, RETICCTPCT in the last 72 hours. Urine analysis: No results found for: COLORURINE, APPEARANCEUR, LABSPEC, PHURINE, GLUCOSEU, HGBUR, BILIRUBINUR, KETONESUR, PROTEINUR, UROBILINOGEN, NITRITE, LEUKOCYTESUR Sepsis Labs: @LABRCNTIP (procalcitonin:4,lacticidven:4) ) Recent Results (from the past 240 hour(s))  SARS Coronavirus 2 by RT PCR (hospital order, performed in [facility] hospital lab) Nasopharyngeal Nasopharyngeal Swab     Status: None   Collection Time: 01/12/20  3:30 AM   Specimen: Nasopharyngeal Swab  Result Value Ref Range Status   SARS Coronavirus 2 NEGATIVE NEGATIVE Final    Comment: (NOTE) SARS-CoV-2 target nucleic acids are NOT DETECTED.  The SARS-CoV-2 RNA is generally detectable in upper and lower respiratory specimens during the acute phase of infection. The lowest concentration of SARS-CoV-2 viral copies this assay can detect is 250 copies / mL. A negative result does not preclude  SARS-CoV-2 infection and should not be used as the sole basis for treatment or other patient management decisions.  A negative result may occur with improper specimen collection / handling, submission of specimen other than nasopharyngeal swab, presence of viral mutation(s) within the areas targeted by this assay, and inadequate number of viral copies (<250 copies / mL). A negative result must be combined with clinical observations, patient history, and epidemiological information.  Fact Sheet for Patients:   01/14/20  Fact Sheet for Healthcare Providers: BoilerBrush.com.cy  This test is not yet approved or  cleared by the https://pope.com/ FDA and has been authorized for detection and/or diagnosis of SARS-CoV-2 by FDA under an Emergency Use Authorization (EUA).  This EUA will remain in effect (meaning this test can be used) for the duration of the COVID-19 declaration under Section 564(b)(1) of the Act, 21 U.S.C. section 360bbb-3(b)(1), unless the authorization is terminated or revoked sooner.  Performed at [facility] [address] [place], [place]      Radiological Exams on Admission: DG Chest 2 View  Result Date: 01/12/2020 CLINICAL DATA:  49 year old female with chest pain. EXAM: CHEST - 2 VIEW COMPARISON:  None. FINDINGS: There is mild cardiomegaly with vascular congestion. The right lung base density, likely combination of pleural effusion and associated atelectasis or infiltrate. Underlying mass is not excluded. Clinical correlation and follow-up recommended. No pneumothorax. No acute osseous pathology. IMPRESSION: Cardiomegaly with mild vascular congestion and probable right pleural effusion. Pneumonia or mass not excluded. Clinical correlation and follow-up to resolution recommended. Electronically Signed   By: Elgie Collard M.D.   On: 01/12/2020 00:17   CT Angio Chest PE W and/or Wo  Contrast  Result Date: 01/12/2020 CLINICAL DATA:  49 year old female with concern for pulmonary embolism. EXAM: CT ANGIOGRAPHY CHEST WITH CONTRAST TECHNIQUE: Multidetector CT imaging of the chest was performed using the standard protocol during bolus administration of intravenous contrast. Multiplanar CT image reconstructions and MIPs were obtained to evaluate the vascular anatomy. CONTRAST:  OMNIPAQUE IOHEXOL 350 MG/ML SOLN COMPARISON:  Chest radiograph dated 01/12/2020. FINDINGS: Evaluation of this exam is limited due to respiratory motion artifact. Cardiovascular: Top-normal cardiac size. No pericardial effusion. The thoracic aorta is unremarkable. Evaluation of the pulmonary arteries is very limited due to severe motion artifact. There is however apparent small filling defects in the left upper lobe pulmonary artery branch (71-70 3/7) as well as apparent nonocclusive linear defects in the right lower lobe pulmonary artery branch (57/7 and 129/5) concerning for nonocclusive age indeterminate pulmonary embolism. No large central pulmonary artery embolus identified. Mediastinum/Nodes: No definite hilar or mediastinal adenopathy. The esophagus is grossly unremarkable. No mediastinal fluid collection. Several mildly enlarged right cardiophrenic lymph nodes measure up to 11 mm in short axis. Lungs/Pleura: There is a large right pleural effusion with complete consolidative changes of the right lower lobe which may represent atelectasis or infiltrate. Underlying mass is not excluded clinical correlation and follow-up to resolution recommended. There is diffuse interstitial prominence which may represent edema. No pneumothorax. The central airways remain patent. Upper Abdomen: No acute abnormality. Musculoskeletal: No chest wall abnormality. No acute or significant osseous findings. Review of the MIP images confirms the above findings. IMPRESSION: 1. Very limited study due to respiratory motion artifact. No large  central pulmonary artery embolus. Artifact versus small nonocclusive age indeterminate peripheral pulmonary artery emboli. No CT evidence of right heart straining. 2. Large right pleural effusion with complete consolidative changes of the right lower lobe which may represent atelectasis or infiltrate. Underlying mass is not excluded. Clinical correlation and follow-up to resolution recommended. Thoracentesis may provide additional diagnostic value and symptomatic relief. 3. Diffuse interstitial prominence may represent edema. 4. Mildly enlarged right cardiophrenic lymph nodes, likely reactive. These results were called by telephone at the time of interpretation on 01/12/2020 at 2:59 am to provider [facility] , who verbally acknowledged these results. Electronically Signed   By: Elgie Collard M.D.   On: 01/12/2020 02:58    EKG: Independently reviewed.  Normal sinus rhythm.  Assessment/Plan Principal Problem:   Acute respiratory failure with hypoxia (HCC) Active Problems:   Atrial fibrillation (HCC)   Pleural effusion   Normocytic anemia    1. Acute respiratory failure hypoxia with large right cerebral pleural effusion could be from CHF.  At this time despite taking oral Lasix patient is still having the pleural effusion.  I have consulted pulmonary critical care for possible thoracentesis.  Will check INR.  Check 2D echo.  There is no definite evidence of pneumonia will check procalcitonin. 2. Elevated troponin could be  from possible CHF.  Patient is on aspirin metoprolol and will check 2D echo.  Trend cardiac markers.  Cardiology notified. 3. Left lower extremity edema could be from CHF however since patient only has mostly on the left than the right I have ordered Dopplers of the left lower extremity. 4. Nausea -abdomen appears benign.  Will check LFTs and sonogram of the abdomen. 5. Normocytic normochromic anemia no old labs to compare.  Follow CBC check anemia panel. 6. Hypertension on  metoprolol. 7. History of A. fib presently on flecainide and metoprolol.  Chads 2 vasc score is at least 2.  Patient is on an aspirin.  For now we will continue aspirin await cardiology input and patient also may need thoracentesis so will avoid anticoagulation for now.   DVT prophylaxis: Since patient may need thoracentesis and avoiding anticoagulation and since patient may have DVT which has to be dealt with Dopplers I am avoiding SCDs for now. Code Status: Full code. Family Communication: Discussed with patient. Disposition Plan: Home. Consults called: Cardiology and pulmonary critical care. Admission status: Observation.   Eduard ClosArshad N Finnley Larusso MD [facility] Pager [phone].  If 7PM-7AM, please contact night-coverage [email or web address] Password [email or web address]  01/12/2020, 5:35 AM

## 2020-01-12 NOTE — ED Notes (Signed)
Date and time results received: 01/12/20 12:32 AM  (use smartphrase ".now" to insert current time)  Test: Troponin Critical Value: 231  Name of Provider Notified: Dr.Cardama  Orders Received? Or Actions Taken?:

## 2020-01-12 NOTE — Consult Note (Signed)
Cardiology Consultation:   Patient ID: Natalie Manning MRN: [id number]; DOB: 1970/12/27  Admit date: 01/12/2020 Date of Consult: 01/12/2020  Primary Care Provider: Julieanne Manson, MD [facility] Cardiologist: Armanda Magic, MD (new) [facility] Electrophysiologist:  None   Patient Profile:   Natalie Manning is a 49 y.o. female with a hx of atrial fibrillation, iron deficiency (pt states previously required iron infusions), and HTN who is being seen today for the evaluation of elevated troponin/possible heart failure at the request of Dr. Toniann Fail.  History of Present Illness:   She is originally from [place] and moved here several months ago. She reports an episode of atrial fib that was diagnosed in December 2019. She did not require cardioversion. She has been on flecainide since that time without recurrence. She reports having a prior sleep study that did not show OSA. She has no family history of CAD.  About 2 weeks ago she went to a primary care office for increasing lower extremity edema that had begun about 2 weeks prior. She was given a prescription for PRN Lasix which initially improved symptoms but the edema returned, worse in the left leg than the right leg. On Monday she had her 2nd covid vaccine and developed intermittent diarrhea and vomiting. Yesterday she developed fairly abrupt onset of shortness of breath so came to to the ED for evaluation. She has not had any chest pain, palpitations, bleeding or syncope. No prior labs to compare to - workup so far has shown mild leukocytosis of 12, Hgb 10.1 (microcytic), normal BNP, mild hyperglycemia, hsTroponin 231->168->147, normal LFTs, normal TSH, negative Covid test. CXR showed mild vascular congestion and right pleural effusion, cannot exclude mass or PNA. CTA was very limited due to respiratory motion artifact with possible artifact vs small nonocclusive age-indeterminate peripheral PE, no evidence of right  heart strain, large right pleural effusion (underlying mass not excluded), diffuse interstitial prominence and mildly enlarged right cardiophrenic lymph nodes, likely reactive. Abd US showed cholelithasis without cholecystitis, poorly visualized pancreas and aorta, and large pelvic mass extending up into the abdomen (possibly enlarged fibroid uterus). LE venous duplex pending. IM has consulted pulmonary for thoracentesis.   Past Medical History:  Diagnosis Date  . Atrial fibrillation (HCC) 05/2019  . Hypertension   . Iron deficiency    a. pt recalls previously having to have iron transfusions.  . Obesity     Past Surgical History:  Procedure Laterality Date  . UTERINE FIBROID SURGERY       Home Medications:  Prior to Admission medications   Medication Sig Start Date End Date Taking? Authorizing Provider  aspirin 325 MG tablet Take 325 mg by mouth daily.   Yes [provider]  flecainide (TAMBOCOR) 100 MG tablet Take 1 tablet (100 mg total) by mouth 2 (two) times daily. 12/19/19  Yes Julieanne Manson, MD  furosemide (LASIX) 20 MG tablet 1 tab by mouth in morning for 2 days and then as needed for edema 12/19/19  Yes Julieanne Manson, MD  metoprolol succinate (TOPROL-XL) 50 MG 24 hr tablet Take 1 tablet (50 mg total) by mouth daily. Take with or immediately following a meal. 12/19/19  Yes Julieanne Manson, MD    Inpatient Medications: Scheduled Meds: . aspirin  325 mg Oral Daily  . metoprolol succinate  50 mg Oral Daily  . sodium chloride flush  3 mL Intravenous Once   Continuous Infusions:  PRN Meds: ondansetron **OR** ondansetron (ZOFRAN) IV  Allergies:   No Known Allergies  Social History:   Social History   Socioeconomic History  . Marital status: Legally Separated    Spouse name: Not on file  . Number of children: 1  . Years of education: Not on file  . Highest education level: Not on file  Occupational History  . Not on file  Tobacco Use  . Smoking  status: Never Smoker  . Smokeless tobacco: Never Used  Vaping Use  . Vaping Use: Never used  Substance and Sexual Activity  . Alcohol use: Never  . Drug use: Never  . Sexual activity: Yes    Birth control/protection: None  Other Topics Concern  . Not on file  Social History Narrative   Moved to [place] in Nov 2020 for a job transfer with [employer], then they backed out of the transfer deal.  She is taking a year or two off to decide what to do.   Lives here mainly by herself, but son joins her at times.   Social Determinants of Health   Financial Resource Strain: Medium Risk  . Difficulty of Paying Living Expenses: Somewhat hard  Food Insecurity: No Food Insecurity  . Worried About Programme researcher, broadcasting/film/videounning Out of Food in the Last Year: Never true  . Ran Out of Food in the Last Year: Never true  Transportation Needs: No Transportation Needs  . Lack of Transportation (Medical): No  . Lack of Transportation (Non-Medical): No  Physical Activity:   . Days of Exercise per Week:   . Minutes of Exercise per Session:   Stress: No Stress Concern Present  . Feeling of Stress : Not at all  Social Connections: Socially Isolated  . Frequency of Communication with Friends and Family: Once a week  . Frequency of Social Gatherings with Friends and Family: Never  . Attends Religious Services: Never  . Active Member of Clubs or Organizations: No  . Attends BankerClub or Organization Meetings: Never  . Marital Status: Separated  Intimate Partner Violence: Not At Risk  . Fear of Current or Ex-Partner: No  . Emotionally Abused: No  . Physically Abused: No  . Sexually Abused: No    Family History:    Family History  Problem Relation Age of Onset  . Diabetes Mother   . Hypertension Mother   . Hypertension Sister   . Obesity Sister        had gastric sleeve surgery.  Marland Kitchen. CAD Neg Hx      ROS:  Please see the history of present illness.   All other ROS reviewed and negative.     Physical Exam/Data:    Vitals:   01/12/20 0645 01/12/20 0721 01/12/20 0949 01/12/20 1003  BP: 139/87 131/78 (!) 161/89 (!) 146/83  Pulse: 81 81 90 90  Resp: 16 18 18    Temp:   98.7 F (37.1 C)   TempSrc:   Oral   SpO2: 95% 95% 98%   Weight:      Height:       No intake or output data in the 24 hours ending 01/12/20 1007 Last 3 Weights 01/11/2020 12/19/2019  Weight (lbs) 290 lb 286 lb  Weight (kg) 131.543 kg 129.729 kg     Body mass index is 46.81 kg/m.  General: Well developed, well nourished obese AAF, in no acute distress. Head: Normocephalic, atraumatic, sclera non-icteric, no xanthomas, nares are without discharge. Neck: Negative for carotid bruits. JVP not elevated. Lungs: Clear bilaterally to auscultation without wheezes, rales, or rhonchi. Breathing is unlabored. Heart: RRR S1 S2 without  murmurs, rubs, or gallops.  Abdomen: Soft, non-tender, non-distended with normoactive bowel sounds. No rebound/guarding. Extremities: No clubbing or cyanosis. 1+ LLE edema and trace RLE edema. Distal pedal pulses are 2+ and equal bilaterally. Neuro: Alert and oriented X 3. Moves all extremities spontaneously. Psych:  Responds to questions appropriately with a normal affect.  EKG:  The EKG was personally reviewed and demonstrates:  NSR 82bpm with nonspecific TW changes in III, V3-V6, QTc listed as but end of TW difficult to ascertain.  Telemetry:  Telemetry was personally reviewed and demonstrates:  NSR  Relevant CV Studies: Pending echo  Laboratory Data:  High Sensitivity Troponin:   Recent Labs  Lab 01/11/20 2354 01/12/20 0206 01/12/20 0625  TROPONINIHS 231* 168* 147*     Chemistry Recent Labs  Lab 01/11/20 2354  NA 140  K 3.7  CL 105  CO2 23  GLUCOSE 113*  BUN 16  CREATININE 0.89  CALCIUM 9.3  GFRNONAA >60  GFRAA >60  ANIONGAP 12    Recent Labs  Lab 01/12/20 0625  PROT 8.0  ALBUMIN 3.9  AST 21  ALT 14  ALKPHOS 46  BILITOT 0.5   Hematology Recent Labs  Lab  01/11/20 2354 01/12/20 0625  WBC 12.0*  --   RBC 4.59 4.46  HGB 10.1*  --   HCT 34.4*  --   MCV 74.9*  --   MCH 22.0*  --   MCHC 29.4*  --   RDW 20.0*  --   PLT 224  --    BNP Recent Labs  Lab 01/11/20 2354  BNP 47.3    DDimer No results for input(s): DDIMER in the last 168 hours.   Radiology/Studies:  DG Chest 2 View  Result Date: 01/12/2020 CLINICAL DATA:  49 year old female with chest pain. EXAM: CHEST - 2 VIEW COMPARISON:  None. FINDINGS: There is mild cardiomegaly with vascular congestion. The right lung base density, likely combination of pleural effusion and associated atelectasis or infiltrate. Underlying mass is not excluded. Clinical correlation and follow-up recommended. No pneumothorax. No acute osseous pathology. IMPRESSION: Cardiomegaly with mild vascular congestion and probable right pleural effusion. Pneumonia or mass not excluded. Clinical correlation and follow-up to resolution recommended. Electronically Signed   By: Elgie Collard M.D.   On: 01/12/2020 00:17   CT Angio Chest PE W and/or Wo Contrast  Result Date: 01/12/2020 CLINICAL DATA:  49 year old female with concern for pulmonary embolism. EXAM: CT ANGIOGRAPHY CHEST WITH CONTRAST TECHNIQUE: Multidetector CT imaging of the chest was performed using the standard protocol during bolus administration of intravenous contrast. Multiplanar CT image reconstructions and MIPs were obtained to evaluate the vascular anatomy. CONTRAST:  OMNIPAQUE IOHEXOL 350 MG/ML SOLN COMPARISON:  Chest radiograph dated 01/12/2020. FINDINGS: Evaluation of this exam is limited due to respiratory motion artifact. Cardiovascular: Top-normal cardiac size. No pericardial effusion. The thoracic aorta is unremarkable. Evaluation of the pulmonary arteries is very limited due to severe motion artifact. There is however apparent small filling defects in the left upper lobe pulmonary artery branch (71-70 3/7) as well as apparent nonocclusive  linear defects in the right lower lobe pulmonary artery branch (57/7 and 129/5) concerning for nonocclusive age indeterminate pulmonary embolism. No large central pulmonary artery embolus identified. Mediastinum/Nodes: No definite hilar or mediastinal adenopathy. The esophagus is grossly unremarkable. No mediastinal fluid collection. Several mildly enlarged right cardiophrenic lymph nodes measure up to 11 mm in short axis. Lungs/Pleura: There is a large right pleural effusion with complete consolidative changes of the  right lower lobe which may represent atelectasis or infiltrate. Underlying mass is not excluded clinical correlation and follow-up to resolution recommended. There is diffuse interstitial prominence which may represent edema. No pneumothorax. The central airways remain patent. Upper Abdomen: No acute abnormality. Musculoskeletal: No chest wall abnormality. No acute or significant osseous findings. Review of the MIP images confirms the above findings. IMPRESSION: 1. Very limited study due to respiratory motion artifact. No large central pulmonary artery embolus. Artifact versus small nonocclusive age indeterminate peripheral pulmonary artery emboli. No CT evidence of right heart straining. 2. Large right pleural effusion with complete consolidative changes of the right lower lobe which may represent atelectasis or infiltrate. Underlying mass is not excluded. Clinical correlation and follow-up to resolution recommended. Thoracentesis may provide additional diagnostic value and symptomatic relief. 3. Diffuse interstitial prominence may represent edema. 4. Mildly enlarged right cardiophrenic lymph nodes, likely reactive. These results were called by telephone at the time of interpretation on 01/12/2020 at 2:59 am to provider [facility] , who verbally acknowledged these results. Electronically Signed   By: Elgie Collard M.D.   On: 01/12/2020 02:58   US Abdomen Complete  Result Date:  01/12/2020 CLINICAL DATA:  Nausea, vomiting and diarrhea since Monday. EXAM: ABDOMEN ULTRASOUND COMPLETE COMPARISON:  Chest CT 01/12/2020 FINDINGS: Gallbladder: The gallbladder is mildly contracted. There are large shadowing gallstones. The largest measures 2.3 cm. Associated mild gallbladder wall thickening measuring a maximum of 3.5 mm. No pericholecystic fluid or sonographic Murphy sign Common bile duct: Diameter: 4.7 mm Liver: Normal echogenicity without focal lesion or biliary dilatation. Portal vein is patent on color Doppler imaging with normal direction of blood flow towards the liver. IVC: No abnormality visualized. Pancreas: Poorly visualized. Spleen: Normal size.  No focal lesions. Right Kidney: Length: 11.1 cm. Normal renal cortical thickness and echogenicity without focal lesions or hydronephrosis. Left Kidney: Length: 10.7 cm. Normal renal cortical thickness and echogenicity without focal lesions or hydronephrosis. Abdominal aorta: Unable to visualize. Other findings: Large pelvic mass extending up into the abdomen possibly enlarged fibroid uterus. Right pleural effusion. IMPRESSION: 1. Cholelithiasis with slightly contracted gallbladder and gallbladder wall thickening but no pericholecystic fluid or sonographic Murphy sign to suggest acute cholecystitis. 2. Poorly visualized pancreas and aorta. 3. Large pelvic mass extending up into the abdomen possibly enlarged fibroid uterus. Recommend CT abdomen pelvis with IV and oral contrast for further evaluation. 4. Right pleural effusion. Electronically Signed   By: Rudie Meyer M.D.   On: 01/12/2020 07:43    New York Heart Association (NYHA) Functional Class NYHA Class III As of symptoms yesterday  Assessment and Plan:   1. Shortness of breath and lower extremity edema with large right pleural effusion, also with findings of large pelvic mass extending into the abdomen, as well as artifact vs small nonocclusive age indeterminate PE - unclear at  this time whether this represents CHF or perhaps an alternative process such as malignancy +/- PE. Primary team notes indicate they planned to consult pulmonary for thoracentesis which would be helpful from diagnostic standpoint. Abdominal US also raised question of pelvic mass which will require further imaging.  - Await echo and LE venous duplex - Do not see notation about possible PE in H&P - have sent message to IM attending taking over inquiring about heparin, since she is being ruled out for DVT as well - Do not see diuretic ordered yet; await plans for contrast (had CTA yesterday and abdominal US recommending another one to evaluate pelvic mass)  2. Elevated troponin - suspect demand process as she has not had any anginal-type chest pain. EKG also appears nonspecifically abnormal at baseline without prior to compare to. - Await echo - She has been continued on home aspirin dose - consider decreasing to 81mg  daily - Check lipids in AM - Continue beta blocker - Will review with cardiology MD  3. History of isolated episode of atrial fibrillation in 2019 - per IM notes, there were plans to possibly get her off flecainide then Covid hit last year so this was continued.  - With isolated episode only in 2019, would stop flecainide for now, particularly in light of positive troponin.  - CHADSVASC is 2 for HTN, female (pending assessment of LV function as well) but not presently on anticoagulation. IM plans to hold off given plan for thoracentesis - await their input regarding her CTA findings/heparin as above - Continue metoprolol - Follow on telemetry  4. Essential HTN - blood pressure has been marginally elevated since admission. - Continue metoprolol - Anticipate following with potential diuresis once diagnostics complete  5. Microcytic anemia - pt reports history of iron deficiency requiring infusion previously. - Per primary team  6. Diarrhea/vomiting - post covid vaccine #2. - Per  primary team  For questions or updates, please contact [facility] Please consult [email or web address] for contact info under    Signed, 2020, PA-C  01/12/2020 10:07 AM

## 2020-01-12 NOTE — ED Provider Notes (Signed)
[facility]-EMERGENCY DEPT Provider Note  CSN: [id number] Arrival date & time: 01/11/20 2306  Chief Complaint(s) Shortness of Breath and Emesis  HPI Natalie Manning is a 49 y.o. female  CC: Emesis  Context: Patient received the second Covid vaccine on Monday.  That evening her symptoms began.  Onset/Duration: 3 days Timing: Intermittent Quality: Nonbloody nonbilious Severity: Moderate Modifying Factors:  Improved by: Self resolved  Worsened by: Oral intake Associated Signs/Symptoms:  Pertinent (+): Diarrhea, mild chills, shortness of breath that began around 7 PM  Pertinent (-): Fevers, myalgias, abdominal pain, chest pain  Patient recently moved from [place] 9 months ago.  Establish care with a primary care provider and saw them last month.  At that time patient had new lower extremity edema worse on the left and prescribed furosemide.  Patient still has left-sided lower extremity edema.  She denies any prior history of DVT/PEs.  HPI  Past Medical History Past Medical History:  Diagnosis Date  . Atrial fibrillation (HCC) 05/2019  . Dysrhythmia   . Hypertension   . Obesity    Patient Active Problem List   Diagnosis Date Noted  . Obesity   . Atrial fibrillation (HCC) 05/2019   Home Medication(s) Prior to Admission medications   Medication Sig Start Date End Date Taking? Authorizing Provider  aspirin 325 MG tablet Take 325 mg by mouth daily.   Yes [provider]  flecainide (TAMBOCOR) 100 MG tablet Take 1 tablet (100 mg total) by mouth 2 (two) times daily. 12/19/19  Yes Julieanne Manson, MD  furosemide (LASIX) 20 MG tablet 1 tab by mouth in morning for 2 days and then as needed for edema 12/19/19  Yes Julieanne Manson, MD  metoprolol succinate (TOPROL-XL) 50 MG 24 hr tablet Take 1 tablet (50 mg total) by mouth daily. Take with or immediately following a meal. 12/19/19  Yes Julieanne Manson, MD                                                                                                                                     Past Surgical History Past Surgical History:  Procedure Laterality Date  . UTERINE FIBROID SURGERY     Family History Family History  Problem Relation Age of Onset  . Diabetes Mother   . Hypertension Mother   . Hypertension Sister   . Obesity Sister        had gastric sleeve surgery.    Social History Social History   Tobacco Use  . Smoking status: Never Smoker  . Smokeless tobacco: Never Used  Vaping Use  . Vaping Use: Never used  Substance Use Topics  . Alcohol use: Never  . Drug use: Never   Allergies Patient has no known allergies.  Review of Systems Review of Systems All other systems are reviewed and are negative for acute change except as noted in the HPI  Physical Exam Vital Signs  I have reviewed the triage vital signs BP (!) 141/82 (BP Location: Left Arm)   Pulse 84   Temp 98.4 F (36.9 C) (Oral)   Resp 19   Ht  (1.676 m)   Wt 131.5 kg   SpO2 94%   BMI 46.81 kg/m   Physical Exam Vitals reviewed.  Constitutional:      General: She is not in acute distress.    Appearance: She is well-developed. She is obese. She is not diaphoretic.  HENT:     Head: Normocephalic and atraumatic.     Nose: Nose normal.  Eyes:     General: No scleral icterus.       Right eye: No discharge.        Left eye: No discharge.     Conjunctiva/sclera: Conjunctivae normal.     Pupils: Pupils are equal, round, and reactive to light.  Cardiovascular:     Rate and Rhythm: Normal rate and regular rhythm.     Heart sounds: No murmur heard.  No friction rub. No gallop.   Pulmonary:     Effort: Pulmonary effort is normal. No respiratory distress.     Breath sounds: No stridor. Examination of the right-middle field reveals decreased breath sounds. Examination of the right-lower field reveals decreased breath sounds. Decreased breath sounds present. No rales.  Abdominal:      General: There is no distension.     Palpations: Abdomen is soft.     Tenderness: There is no abdominal tenderness.  Musculoskeletal:        General: No tenderness.     Cervical back: Normal range of motion and neck supple.     Left lower leg: Swelling present.  Skin:    General: Skin is warm and dry.     Findings: No erythema or rash.  Neurological:     Mental Status: She is alert and oriented to person, place, and time.     ED Results and Treatments Labs (all labs ordered are listed, but only abnormal results are displayed) Labs Reviewed  BASIC METABOLIC PANEL - Abnormal; Notable for the following components:      Result Value   Glucose, Bld 113 (*)    All other components within normal limits  CBC - Abnormal; Notable for the following components:   WBC 12.0 (*)    Hemoglobin 10.1 (*)    HCT 34.4 (*)    MCV 74.9 (*)    MCH 22.0 (*)    MCHC 29.4 (*)    RDW 20.0 (*)    All other components within normal limits  TROPONIN I (HIGH SENSITIVITY) - Abnormal; Notable for the following components:   Troponin I (High Sensitivity) 231 (*)    All other components within normal limits  TROPONIN I (HIGH SENSITIVITY) - Abnormal; Notable for the following components:   Troponin I (High Sensitivity) 168 (*)    All other components within normal limits  SARS CORONAVIRUS 2 BY RT PCR (HOSPITAL ORDER, PERFORMED IN [facility])  LIPASE, BLOOD  BRAIN NATRIURETIC PEPTIDE  URINALYSIS, ROUTINE W REFLEX MICROSCOPIC  I-STAT BETA HCG BLOOD, ED ([facility], [facility], [facility] ONLY)  I-STAT BETA HCG BLOOD, ED (NOT ORDERABLE)  EKG  EKG Interpretation  Date/Time:  Wednesday January 11 2020 23:44:13 EDT Ventricular Rate:  82 PR Interval:    QRS Duration: 99 QT Interval:  508 QTC Calculation: 594 R Axis:   -10 Text Interpretation: Sinus rhythm RSR' in V1 or V2, right VCD or RVH Borderline  T abnormalities, anterior leads Prolonged QT interval 12 Lead; Mason-Likar NO STEMI. No old tracing to compare Confirmed by Drema Pry [phone]) on 01/12/2020 12:32:25 AM      Radiology DG Chest 2 View  Result Date: 01/12/2020 CLINICAL DATA:  49 year old female with chest pain. EXAM: CHEST - 2 VIEW COMPARISON:  None. FINDINGS: There is mild cardiomegaly with vascular congestion. The right lung base density, likely combination of pleural effusion and associated atelectasis or infiltrate. Underlying mass is not excluded. Clinical correlation and follow-up recommended. No pneumothorax. No acute osseous pathology. IMPRESSION: Cardiomegaly with mild vascular congestion and probable right pleural effusion. Pneumonia or mass not excluded. Clinical correlation and follow-up to resolution recommended. Electronically Signed   By: Elgie Collard M.D.   On: 01/12/2020 00:17   CT Angio Chest PE W and/or Wo Contrast  Result Date: 01/12/2020 CLINICAL DATA:  48 year old female with concern for pulmonary embolism. EXAM: CT ANGIOGRAPHY CHEST WITH CONTRAST TECHNIQUE: Multidetector CT imaging of the chest was performed using the standard protocol during bolus administration of intravenous contrast. Multiplanar CT image reconstructions and MIPs were obtained to evaluate the vascular anatomy. CONTRAST:  OMNIPAQUE IOHEXOL 350 MG/ML SOLN COMPARISON:  Chest radiograph dated 01/12/2020. FINDINGS: Evaluation of this exam is limited due to respiratory motion artifact. Cardiovascular: Top-normal cardiac size. No pericardial effusion. The thoracic aorta is unremarkable. Evaluation of the pulmonary arteries is very limited due to severe motion artifact. There is however apparent small filling defects in the left upper lobe pulmonary artery branch (71-70 3/7) as well as apparent nonocclusive linear defects in the right lower lobe pulmonary artery branch (57/7 and 129/5) concerning for nonocclusive age indeterminate pulmonary  embolism. No large central pulmonary artery embolus identified. Mediastinum/Nodes: No definite hilar or mediastinal adenopathy. The esophagus is grossly unremarkable. No mediastinal fluid collection. Several mildly enlarged right cardiophrenic lymph nodes measure up to 11 mm in short axis. Lungs/Pleura: There is a large right pleural effusion with complete consolidative changes of the right lower lobe which may represent atelectasis or infiltrate. Underlying mass is not excluded clinical correlation and follow-up to resolution recommended. There is diffuse interstitial prominence which may represent edema. No pneumothorax. The central airways remain patent. Upper Abdomen: No acute abnormality. Musculoskeletal: No chest wall abnormality. No acute or significant osseous findings. Review of the MIP images confirms the above findings. IMPRESSION: 1. Very limited study due to respiratory motion artifact. No large central pulmonary artery embolus. Artifact versus small nonocclusive age indeterminate peripheral pulmonary artery emboli. No CT evidence of right heart straining. 2. Large right pleural effusion with complete consolidative changes of the right lower lobe which may represent atelectasis or infiltrate. Underlying mass is not excluded. Clinical correlation and follow-up to resolution recommended. Thoracentesis may provide additional diagnostic value and symptomatic relief. 3. Diffuse interstitial prominence may represent edema. 4. Mildly enlarged right cardiophrenic lymph nodes, likely reactive. These results were called by telephone at the time of interpretation on 01/12/2020 at 2:59 am to provider [facility] , who verbally acknowledged these results. Electronically Signed   By: Elgie Collard M.D.   On: 01/12/2020 02:58    Pertinent labs & imaging results that were available during my care of the  patient were reviewed by me and considered in my medical decision making (see chart for  details).  Medications Ordered in ED Medications  sodium chloride flush (NS) 0.9 % injection 3 mL (3 mLs Intravenous Not Given 01/12/20 0305)  0.9 %  sodium chloride infusion (has no administration in time range)  iohexol (OMNIPAQUE) 350 MG/ML injection 100 mL (100 mLs Intravenous Contrast Given 01/12/20 0231)                                                                                                                                    Procedures .1-3 Lead EKG Interpretation Performed by: Nira Conn, MD Authorized by: Nira Conn, MD     Interpretation: normal     ECG rate:  83   ECG rate assessment: normal     Rhythm: sinus rhythm     Ectopy: none     Conduction: normal   .Critical Care Performed by: Nira Conn, MD Authorized by: Nira Conn, MD    CRITICAL CARE Performed by: Amadeo Garnet Kiylah Loyer Total critical care time: 55 minutes Critical care time was exclusive of separately billable procedures and treating other patients. Critical care was necessary to treat or prevent imminent or life-threatening deterioration. Critical care was time spent personally by me on the following activities: development of treatment plan with patient and/or surrogate as well as nursing, discussions with consultants, evaluation of patient's response to treatment, examination of patient, obtaining history from patient or surrogate, ordering and performing treatments and interventions, ordering and review of laboratory studies, ordering and review of radiographic studies, pulse oximetry and re-evaluation of patient's condition.    (including critical care time)  Medical Decision Making / ED Course I have reviewed the nursing notes for this encounter and the patient's prior records (if available in EHR or on provided paperwork).   Natalie Manning was evaluated in Emergency Department on 01/12/2020 for the symptoms described in the history of  present illness. She was evaluated in the context of the global COVID-19 pandemic, which necessitated consideration that the patient might be at risk for infection with the SARS-CoV-2 virus that causes COVID-19. Institutional protocols and algorithms that pertain to the evaluation of patients at risk for COVID-19 are in a state of rapid change based on information released by regulatory bodies including the CDC and federal and state organizations. These policies and algorithms were followed during the patient's care in the ED.  Patient presented for nausea and vomiting which she attributed to having the second Covid vaccine. On exam patient had decreased breath sounds on the right side and had unilateral left lower extremity swelling.  Labs drawn in triage were notable for leukocytosis and microcytic anemia.  No significant electrolyte derangements or renal sufficiency.  Patient's troponin was elevated.  EKG showed nonspecific T wave changes without any priors for comparison.  Chest x-ray showed right pleural effusion with possible pneumonia versus  mass.  Given the unilateral leg swelling, there was a concern for possible pulmonary embolism.  CTA was obtained which revealed a large right pleural effusion.  Cannot rule out mass versus pneumonia versus PE.  There was no right heart strain.  Patient's BNP was within normal limits. This does not fit heart failure picture.  Patient will need to be admitted for further work-up and management to obtain an ultrasound of the left lower extremity to assess for possible DVT.  PE still on the differential.  Also will need to continue trending the troponins as ACS for the patient's dyspnea on exertion and elevated trops, she is to be considered.  Right pleural effusion will likely need to be aspirated and sent for analysis.  Case is discussed with Dr. Toniann FailKakrakandy from the hospitalist service who will see the patient for admission.       Final Clinical  Impression(s) / ED Diagnoses Final diagnoses:  Elevated troponin  Pleural effusion on right      This chart was dictated using voice recognition software.  Despite best efforts to proofread,  errors can occur which can change the documentation meaning.   Nira Connardama, Riki Berninger Eduardo, MD 01/12/20 [phone]

## 2020-01-12 NOTE — ED Notes (Signed)
ED TO INPATIENT HANDOFF REPORT  ED Nurse Name and Phone #: [phone]  S Name/Age/Gender Natalie Manning 49 y.o. female Room/Bed: WA18/WA18  Code Status   Code Status: Full Code  Home/SNF/Other Home Patient oriented to: self, place, time and situation Is this baseline? Yes   Triage Complete: Triage complete  Chief Complaint Acute respiratory failure with hypoxia [facility]) [J96.01]  Triage Note Patient had second covid shot on Monday. Patient states she has been throwing up, diarrhea, and a severe headache.      Allergies No Known Allergies  Level of Care/Admitting Diagnosis ED Disposition    ED Disposition Condition Comment   Admit  Hospital Area: [facility] [[id number]]  Level of Care: Telemetry [5]  Admit to tele based on following criteria: Monitor for Ischemic changes  Covid Evaluation: Confirmed COVID Negative  Diagnosis: Acute respiratory failure with hypoxia [facility]) [[id number]]  Admitting Physician: Eduard Clos [phone]  Attending Physician: Eduard Clos Florian.Pax       B Medical/Surgery History Past Medical History:  Diagnosis Date  . Atrial fibrillation (HCC) 05/2019  . Hypertension   . Obesity    Past Surgical History:  Procedure Laterality Date  . UTERINE FIBROID SURGERY       A IV Location/Drains/Wounds Patient Lines/Drains/Airways Status    Active Line/Drains/Airways    Name Placement date Placement time Site Days   Peripheral IV 01/12/20 Left Antecubital 01/12/20  0140  Antecubital  less than 1          Intake/Output Last 24 hours No intake or output data in the 24 hours ending 01/12/20 0908  Labs/Imaging Results for orders placed or performed during the hospital encounter of 01/12/20 (from the past 48 hour(s))  Basic metabolic panel     Status: Abnormal   Collection Time: 01/11/20 11:54 PM  Result Value Ref Range   Sodium 140 135 - 145 mmol/L   Potassium 3.7 3.5 - 5.1 mmol/L   Chloride 105 98 - 111  mmol/L   CO2 23 22 - 32 mmol/L   Glucose, Bld 113 (H) 70 - 99 mg/dL    Comment: Glucose reference range applies only to samples taken after fasting for at least 8 hours.   BUN 16 6 - 20 mg/dL   Creatinine, Ser 9.67 0.44 - 1.00 mg/dL   Calcium 9.3 8.9 - 59.1 mg/dL   GFR calc non Af Amer >60 >60 mL/min   GFR calc Af Amer >60 >60 mL/min   Anion gap 12 5 - 15    Comment: Performed at [facility], [address] [place], [place] [address]  CBC     Status: Abnormal   Collection Time: 01/11/20 11:54 PM  Result Value Ref Range   WBC 12.0 (H) 4.0 - 10.5 K/uL   RBC 4.59 3.87 - 5.11 MIL/uL   Hemoglobin 10.1 (L) 12.0 - 15.0 g/dL   HCT 65.9 (L) 36 - 46 %   MCV 74.9 (L) 80.0 - 100.0 fL   MCH 22.0 (L) 26.0 - 34.0 pg   MCHC 29.4 (L) 30.0 - 36.0 g/dL   RDW 93.5 (H) 70.1 - 77.9 %   Platelets 224 150 - 400 K/uL   nRBC 0.0 0.0 - 0.2 %    Comment: Performed at [facility], [address], [place], [place] [address]  Troponin I (High Sensitivity)     Status: Abnormal   Collection Time: 01/11/20 11:54 PM  Result Value Ref Range   Troponin  I (High Sensitivity) 231 (HH) <18 ng/L    Comment: CRITICAL RESULT CALLED TO, READ BACK BY AND VERIFIED WITH: RN I HODGES AT 0030 01/12/20 CRUICKSHANK A (NOTE) Elevated high sensitivity troponin I (hsTnI) values and significant  changes across serial measurements may suggest ACS but many other  chronic and acute conditions are known to elevate hsTnI results.  Refer to the Links section for chest pain algorithms and additional  guidance. Performed at [facility], [address] [place], [place] [address]   Lipase, blood     Status: None   Collection Time: 01/11/20 11:54 PM  Result Value Ref Range   Lipase 30 11 - 51 U/L    Comment: Performed at [facility], [address] [place], [place] [address]  Brain natriuretic peptide     Status: None   Collection Time: 01/11/20 11:54 PM   Result Value Ref Range   B Natriuretic Peptide 47.3 0.0 - 100.0 pg/mL    Comment: Performed at [facility], [address] [place], [place] [address]  I-Stat beta hCG blood, ED     Status: None   Collection Time: 01/12/20 12:16 AM  Result Value Ref Range   I-stat hCG, quantitative <5.0 <5 mIU/mL   Comment 3            Comment:   GEST. AGE      CONC.  (mIU/mL)   <=1 WEEK        5 - 50     2 WEEKS       50 - 500     3 WEEKS       100 - 10,000     4 WEEKS     1,000 - 30,000        FEMALE AND NON-PREGNANT FEMALE:     LESS THAN 5 mIU/mL   Troponin I (High Sensitivity)     Status: Abnormal   Collection Time: 01/12/20  2:06 AM  Result Value Ref Range   Troponin I (High Sensitivity) 168 (HH) <18 ng/L    Comment: CRITICAL VALUE NOTED.  VALUE IS CONSISTENT WITH PREVIOUSLY REPORTED AND CALLED VALUE. DELTA CHECK NOTED (NOTE) Elevated high sensitivity troponin I (hsTnI) values and significant  changes across serial measurements may suggest ACS but many other  chronic and acute conditions are known to elevate hsTnI results.  Refer to the Links section for chest pain algorithms and additional  guidance. Performed at [facility], [address] [place], [place] [address]   SARS Coronavirus 2 by RT PCR (hospital order, performed in [facility] hospital lab) Nasopharyngeal Nasopharyngeal Swab     Status: None   Collection Time: 01/12/20  3:30 AM   Specimen: Nasopharyngeal Swab  Result Value Ref Range   SARS Coronavirus 2 NEGATIVE NEGATIVE    Comment: (NOTE) SARS-CoV-2 target nucleic acids are NOT DETECTED.  The SARS-CoV-2 RNA is generally detectable in upper and lower respiratory specimens during the acute phase of infection. The lowest concentration of SARS-CoV-2 viral copies this assay can detect is 250 copies / mL. A negative result does not preclude SARS-CoV-2 infection and should not be used as the sole basis for treatment or other patient  management decisions.  A negative result may occur with improper specimen collection / handling, submission of specimen other than nasopharyngeal swab, presence of viral mutation(s) within the areas targeted by this assay, and inadequate number of viral copies (<250 copies / mL). A negative result  must be combined with clinical observations, patient history, and epidemiological information.  Fact Sheet for Patients:   BoilerBrush.com.cy  Fact Sheet for Healthcare Providers: https://pope.com/  This test is not yet approved or  cleared by the [place] FDA and has been authorized for detection and/or diagnosis of SARS-CoV-2 by FDA under an Emergency Use Authorization (EUA).  This EUA will remain in effect (meaning this test can be used) for the duration of the COVID-19 declaration under Section 564(b)(1) of the Act, 21 U.S.C. section 360bbb-3(b)(1), unless the authorization is terminated or revoked sooner.  Performed at [facility], [address] [place], [place] [address]   Hepatic function panel     Status: None   Collection Time: 01/12/20  6:25 AM  Result Value Ref Range   Total Protein 8.0 6.5 - 8.1 g/dL   Albumin 3.9 3.5 - 5.0 g/dL   AST 21 15 - 41 U/L   ALT 14 0 - 44 U/L   Alkaline Phosphatase 46 38 - 126 U/L   Total Bilirubin 0.5 0.3 - 1.2 mg/dL   Bilirubin, Direct <2.5 0.0 - 0.2 mg/dL   Indirect Bilirubin NOT CALCULATED 0.3 - 0.9 mg/dL    Comment: Performed at [facility], [address], [place], [place] [address]  TSH     Status: None   Collection Time: 01/12/20  6:25 AM  Result Value Ref Range   TSH 2.483 0.350 - 4.500 uIU/mL    Comment: Performed by a 3rd Generation assay with a functional sensitivity of <=0.01 uIU/mL. Performed at [facility], [address] [place], [place] [address]   Magnesium     Status: None   Collection Time: 01/12/20  6:25 AM   Result Value Ref Range   Magnesium 2.1 1.7 - 2.4 mg/dL    Comment: Performed at [facility], [address], [place], [place] [address]  Protime-INR     Status: Abnormal   Collection Time: 01/12/20  6:25 AM  Result Value Ref Range   Prothrombin Time 17.3 (H) 11.4 - 15.2 seconds   INR 1.5 (H) 0.8 - 1.2    Comment: (NOTE) INR goal varies based on device and disease states. Performed at [facility], [address] [place] [address], [place], [place] [address]   Vitamin B12     Status: None   Collection Time: 01/12/20  6:25 AM  Result Value Ref Range   Vitamin B-12 203 180 - 914 pg/mL    Comment: (NOTE) This assay is not validated for testing neonatal or myeloproliferative syndrome specimens for Vitamin B12 levels. Performed at [facility], [address] [place], [place] [address]   Folate     Status: None   Collection Time: 01/12/20  6:25 AM  Result Value Ref Range   Folate 10.9 >5.9 ng/mL    Comment: Performed at [facility], [address], [place], [place] [address]  Iron and TIBC     Status: Abnormal   Collection Time: 01/12/20  6:25 AM  Result Value Ref Range   Iron 16 (L) 28 - 170 ug/dL   TIBC 932 671 - 245 ug/dL   Saturation Ratios 4 (L) 10.4 - 31.8 %   UIBC 409 ug/dL    Comment: Performed at [facility], [address] [place], [place] [address]  Ferritin     Status: None   Collection Time: 01/12/20  6:25 AM  Result Value Ref Range  Ferritin 13 11 - 307 ng/mL    Comment: Performed at [facility], [address] [place], [place] [address]  Reticulocytes     Status: Abnormal   Collection Time: 07/[facility]/21[facility]  6:25 AM  Result Value Ref Range   Retic Ct Pct 1.9 0.4 - 3.1 %   RBC. 4.46 3.87 - 5.11 MIL/uL   Retic Count, Absolute 86.5 19.0 - 186.0 K/uL   Immature Retic Fract 31.3 (H) 2.3 - 15.9 %    Comment: Performed at [facility], [address] [place], [place] [address]  Troponin I (High Sensitivity)     Status: Abnormal   Collection Time: 01/12/20  6:25 AM  Result Value Ref Range   Troponin I (High Sensitivity) 147 (HH) <18 ng/L    Comment: DELTA CHECK NOTED CRITICAL VALUE NOTED.  VALUE IS CONSISTENT WITH PREVIOUSLY REPORTED AND CALLED VALUE. (NOTE) Elevated high sensitivity troponin I (hsTnI) values and significant  changes across serial measurements may suggest ACS but many other  chronic and acute conditions are known to elevate hsTnI results.  Refer to the Links section for chest pain algorithms and additional  guidance. Performed at [facility], [address] [place], [place] [address]   Procalcitonin - Baseline     Status: None   Collection Time: 01/12/20  6:25 AM  Result Value Ref Range   Procalcitonin <0.10 ng/mL    Comment:        Interpretation: PCT (Procalcitonin) <= 0.5 ng/mL: Systemic infection (sepsis) is not likely. Local bacterial infection is possible. (NOTE)       Sepsis PCT Algorithm           Lower Respiratory Tract                                      Infection PCT Algorithm    ----------------------------     ----------------------------         PCT < 0.25 ng/mL                PCT < 0.10 ng/mL          Strongly encourage             Strongly discourage   discontinuation of antibiotics    initiation of antibiotics    ----------------------------     -----------------------------       PCT 0.25 - 0.50 ng/mL            PCT 0.10 - 0.25 ng/mL               OR       >80% decrease in PCT            Discourage initiation of                                            antibiotics      Encourage discontinuation           of antibiotics    ----------------------------     -----------------------------         PCT >= 0.50 ng/mL              PCT 0.26 - 0.50 ng/mL  AND        <80% decrease in PCT             Encourage initiation of                                              antibiotics       Encourage continuation           of antibiotics    ----------------------------     -----------------------------        PCT >= 0.50 ng/mL                  PCT > 0.50 ng/mL               AND         increase in PCT                  Strongly encourage                                      initiation of antibiotics    Strongly encourage escalation           of antibiotics                                     -----------------------------                                           PCT <= 0.25 ng/mL                                                 OR                                        > 80% decrease in PCT                                      Discontinue / Do not initiate                                             antibiotics  Performed at [facility], [address], [place], [place] [address]   Urinalysis, Routine w reflex microscopic     Status: Abnormal   Collection Time: 01/12/20  6:43 AM  Result Value Ref Range   Color, Urine YELLOW YELLOW   APPearance CLEAR CLEAR   Specific Gravity, Urine 1.010 1.005 - 1.030   pH 5.0 5.0 - 8.0   Glucose, UA NEGATIVE NEGATIVE mg/dL   Hgb urine dipstick SMALL (A) NEGATIVE   Bilirubin Urine NEGATIVE NEGATIVE   Ketones, ur NEGATIVE NEGATIVE mg/dL   Protein, ur NEGATIVE  NEGATIVE mg/dL   Nitrite NEGATIVE NEGATIVE   Leukocytes,Ua NEGATIVE NEGATIVE   RBC / HPF 0-5 0 - 5 RBC/hpf   WBC, UA 0-5 0 - 5 WBC/hpf   Bacteria, UA RARE (A) NONE SEEN   Squamous Epithelial / LPF 0-5 0 - 5   Mucus PRESENT    Urine-Other URIC ACID CRYSTALS     Comment: Performed at [facility], [address] [place], [place] [address]   DG Chest 2 View  Result Date: 01/12/2020 CLINICAL DATA:  49 year old female with chest pain. EXAM: CHEST - 2 VIEW COMPARISON:  None. FINDINGS: There is mild cardiomegaly with vascular congestion. The right lung base density, likely combination of pleural effusion and associated  atelectasis or infiltrate. Underlying mass is not excluded. Clinical correlation and follow-up recommended. No pneumothorax. No acute osseous pathology. IMPRESSION: Cardiomegaly with mild vascular congestion and probable right pleural effusion. Pneumonia or mass not excluded. Clinical correlation and follow-up to resolution recommended. Electronically Signed   By: Elgie Collard M.D.   On: 01/12/2020 00:17   CT Angio Chest PE W and/or Wo Contrast  Result Date: 01/12/2020 CLINICAL DATA:  49 year old female with concern for pulmonary embolism. EXAM: CT ANGIOGRAPHY CHEST WITH CONTRAST TECHNIQUE: Multidetector CT imaging of the chest was performed using the standard protocol during bolus administration of intravenous contrast. Multiplanar CT image reconstructions and MIPs were obtained to evaluate the vascular anatomy. CONTRAST:  OMNIPAQUE IOHEXOL 350 MG/ML SOLN COMPARISON:  Chest radiograph dated 01/12/2020. FINDINGS: Evaluation of this exam is limited due to respiratory motion artifact. Cardiovascular: Top-normal cardiac size. No pericardial effusion. The thoracic aorta is unremarkable. Evaluation of the pulmonary arteries is very limited due to severe motion artifact. There is however apparent small filling defects in the left upper lobe pulmonary artery branch (71-70 3/7) as well as apparent nonocclusive linear defects in the right lower lobe pulmonary artery branch (57/7 and 129/5) concerning for nonocclusive age indeterminate pulmonary embolism. No large central pulmonary artery embolus identified. Mediastinum/Nodes: No definite hilar or mediastinal adenopathy. The esophagus is grossly unremarkable. No mediastinal fluid collection. Several mildly enlarged right cardiophrenic lymph nodes measure up to 11 mm in short axis. Lungs/Pleura: There is a large right pleural effusion with complete consolidative changes of the right lower lobe which may represent atelectasis or infiltrate. Underlying mass is not  excluded clinical correlation and follow-up to resolution recommended. There is diffuse interstitial prominence which may represent edema. No pneumothorax. The central airways remain patent. Upper Abdomen: No acute abnormality. Musculoskeletal: No chest wall abnormality. No acute or significant osseous findings. Review of the MIP images confirms the above findings. IMPRESSION: 1. Very limited study due to respiratory motion artifact. No large central pulmonary artery embolus. Artifact versus small nonocclusive age indeterminate peripheral pulmonary artery emboli. No CT evidence of right heart straining. 2. Large right pleural effusion with complete consolidative changes of the right lower lobe which may represent atelectasis or infiltrate. Underlying mass is not excluded. Clinical correlation and follow-up to resolution recommended. Thoracentesis may provide additional diagnostic value and symptomatic relief. 3. Diffuse interstitial prominence may represent edema. 4. Mildly enlarged right cardiophrenic lymph nodes, likely reactive. These results were called by telephone at the time of interpretation on 01/12/2020 at 2:59 am to provider [facility] , who verbally acknowledged these results. Electronically Signed   By: Elgie Collard M.D.   On: 01/12/2020 02:58   US Abdomen Complete  Result Date: 01/12/2020 CLINICAL DATA:  Nausea, vomiting and diarrhea since Monday. EXAM:  ABDOMEN ULTRASOUND COMPLETE COMPARISON:  Chest CT 01/12/2020 FINDINGS: Gallbladder: The gallbladder is mildly contracted. There are large shadowing gallstones. The largest measures 2.3 cm. Associated mild gallbladder wall thickening measuring a maximum of 3.5 mm. No pericholecystic fluid or sonographic Murphy sign Common bile duct: Diameter: 4.7 mm Liver: Normal echogenicity without focal lesion or biliary dilatation. Portal vein is patent on color Doppler imaging with normal direction of blood flow towards the liver. IVC: No abnormality  visualized. Pancreas: Poorly visualized. Spleen: Normal size.  No focal lesions. Right Kidney: Length: 11.1 cm. Normal renal cortical thickness and echogenicity without focal lesions or hydronephrosis. Left Kidney: Length: 10.7 cm. Normal renal cortical thickness and echogenicity without focal lesions or hydronephrosis. Abdominal aorta: Unable to visualize. Other findings: Large pelvic mass extending up into the abdomen possibly enlarged fibroid uterus. Right pleural effusion. IMPRESSION: 1. Cholelithiasis with slightly contracted gallbladder and gallbladder wall thickening but no pericholecystic fluid or sonographic Murphy sign to suggest acute cholecystitis. 2. Poorly visualized pancreas and aorta. 3. Large pelvic mass extending up into the abdomen possibly enlarged fibroid uterus. Recommend CT abdomen pelvis with IV and oral contrast for further evaluation. 4. Right pleural effusion. Electronically Signed   By: Rudie Meyer M.D.   On: 01/12/2020 07:43    Pending Labs Unresulted Labs (From admission, onward) Comment          Start     Ordered   01/13/20 0500  Basic metabolic panel  Tomorrow morning,   R        01/12/20 0535   01/13/20 0500  CBC  Tomorrow morning,   R        01/12/20 0535   01/12/20 0532  HIV Antibody (routine testing w rflx)  (HIV Antibody (Routine testing w reflex) panel)  Once,   STAT        01/12/20 0535          Vitals/Pain Today's Vitals   01/12/20 0519 01/12/20 0645 01/12/20 0721 01/12/20 0726  BP: (!) 151/91 139/87 131/78   Pulse: 89 81 81   Resp: (!) 27 16 18    Temp:      TempSrc:      SpO2: 94% 95% 95%   Weight:      Height:      PainSc:    0-No pain    Isolation Precautions No active isolations  Medications Medications  sodium chloride flush (NS) 0.9 % injection 3 mL (3 mLs Intravenous Not Given 01/12/20 0305)  aspirin tablet 325 mg (has no administration in time range)  metoprolol succinate (TOPROL-XL) 24 hr tablet 50 mg (has no administration in  time range)  ondansetron (ZOFRAN) tablet 4 mg (has no administration in time range)    Or  ondansetron (ZOFRAN) injection 4 mg (has no administration in time range)  iohexol (OMNIPAQUE) 350 MG/ML injection 100 mL (100 mLs Intravenous Contrast Given 01/12/20 0231)    Mobility walks Low fall risk   Focused Assessments Cardiac Assessment Handoff:    No results found for: CKTOTAL, CKMB, CKMBINDEX, TROPONINI No results found for: DDIMER Does the Patient currently have chest pain? No      R Recommendations: See Admitting Provider Note  Report given to:   Additional Notes:

## 2020-01-12 NOTE — ED Notes (Signed)
ECHO at bedside.

## 2020-01-12 NOTE — Progress Notes (Signed)
PROGRESS NOTE    Natalie Manning  GNF:[id number] DOB: [birth year] DOA: 01/12/2020 PCP: Julieanne Manson, MD   Brief Narrative:  Natalie Manning is a 49 y.o. female with history of A. fib diagnosed in 2019 since then patient has been on flecainide and metoprolol has been recently experiencing bilateral lower extremity edema about 2 weeks ago when patient's primary care physician placed patient on Lasix.  About 4 days ago patient had COVID-19 vaccination following which 4 hours later patient developed nausea vomiting and diarrhea.  This resolved the following day.  Then yesterday patient started having nausea and shortness of breath with minimal exertion.  Denies chest pain fever chills abdominal pain.  Patient is lower extremity edema improved with Lasix but left lower extremity swelling persisted. In the ER patient was afebrile. Gets short of breath on exertion. EKG shows normal sinus rhythm. High sensitive troponin was 231 and 168 and BNP was 47.3. WBC count was 12 hemoglobin 10.1. No old labs to compare.  Patient had a CT angiogram of the chest which was not showing any definite pulmonary embolism but showing large right-sided pleural effusion for which radiologist recommended thoracentesis.  On exam patient has left lower extremity edema extending up to the knees.  Abdomen appears benign on exam.  Patient admitted for acute respiratory failure with hypoxia with large right-sided pleural effusion.   Assessment & Plan:   Principal Problem:   Acute respiratory failure with hypoxia (HCC) Active Problems:   Atrial fibrillation (HCC)   Pleural effusion   Normocytic anemia   Acute hypoxic respiratory failure in the setting of anasarca, edema and unilateral right-sided pleural effusion - unknown etiology POA Rule out PE Supply demand mismatch - minimally elevated troponin - ACS ruled out -Patient able to be weaned off of oxygen, status post thoracentesis with pulmonology -Follow  thoracentesis labs for further insight into origin of fluid -Pleural fluid appears to be exudate concerning for malignancy given mass noted at the pelvis at intake -Echocardiogram unremarkable today for reduced ejection fraction or diastolic dysfunction making heart failure unlikely diagnosis -Continue to follow, repeat CTA to rule out PE (initial PE somewhat indeterminate) -Continue Lasix for diuresis, follow I's and O's, patient indicates her dry weight some few weeks ago before swelling initiated was around 260, patient currently weighs around 290  Pelvic mass, incidentally noted -Patient will need close outpatient follow-up with heme-onc in the next few weeks, no current indication for inpatient evaluation or treatment at this time.  HTN, essential -Continue home meds  Anemia, normocytic - likely iron deficient -Stable, follow a.m. labs/iron panel  Afib, Chadsvasc 2 - not on anticoagulation -Patient has not previously been on anticoagulation, follow CTA chest as above, given patient's CHA2DS2-VASc of 2, questionable PE as above in the setting of possible malignancy -Currently rate controlled -cardiology following, appreciate insight and recommendations  DVT prophylaxis:  Hold chemical prophylaxis for now given what appears to be a bloody thoracentesis, lower extremity DVT ultrasound pending hold SCDs until this is completed. Code Status: Full code. Family Communication: None present  Status is: Inpatient  Dispo: The patient is from: Home              Anticipated d/c is to: Home              Anticipated d/c date is: 24 to 48 hours              Patient currently not medically stable for discharge given ongoing need for further work-up  evaluation in the setting of acute hypoxia, edema, pleural effusion and further work-up including but not limited to blood work, imaging  Consultants:   PCCM  Procedures:   Thoracentesis, right-sided 01/12/2020  Antimicrobials:  None  indicated  Subjective: No acute issues or events overnight, respiratory status markedly improving after thoracentesis, otherwise denies nausea, vomiting, diarrhea, constipation, headache, fevers, chills.  Objective: Vitals:   01/12/20 0400 01/12/20 0519 01/12/20 0645 01/12/20 0721  BP: (!) 148/89 (!) 151/91 139/87 131/78  Pulse: 84 89 81 81  Resp: 19 (!) 27 16 18   Temp:      TempSrc:      SpO2: 93% 94% 95% 95%  Weight:      Height:       No intake or output data in the 24 hours ending 01/12/20 0829 Filed Weights   01/11/20 2337  Weight: 131.5 kg    Examination:  General:  Pleasantly resting in bed, No acute distress. HEENT:  Normocephalic atraumatic.  Sclerae nonicteric, noninjected.  Extraocular movements intact bilaterally. Neck:  Without mass or deformity.  Trachea is midline. Lungs: Right-sided rales inferior to T6 otherwise without overt rhonchi or wheeze. Heart: Irregularly irregular without murmurs, rubs, or gallops. Abdomen:  Soft, nontender, nondistended.  Without guarding or rebound. Extremities: Without cyanosis, clubbing, edema, or obvious deformity. Vascular:  Dorsalis pedis and posterior tibial pulses palpable bilaterally. Skin:  Warm and dry, no erythema, no ulcerations.    Data Reviewed: I have personally reviewed following labs and imaging studies  CBC: Recent Labs  Lab 01/11/20 2354  WBC 12.0*  HGB 10.1*  HCT 34.4*  MCV 74.9*  PLT 224   Basic Metabolic Panel: Recent Labs  Lab 01/11/20 2354 01/12/20 0625  NA 140  --   K 3.7  --   CL 105  --   CO2 23  --   GLUCOSE 113*  --   BUN 16  --   CREATININE 0.89  --   CALCIUM 9.3  --   MG  --  2.1   GFR: Estimated Creatinine Clearance: 104.1 mL/min (by C-G formula based on SCr of 0.89 mg/dL). Liver Function Tests: Recent Labs  Lab 01/12/20 0625  AST 21  ALT 14  ALKPHOS 46  BILITOT 0.5  PROT 8.0  ALBUMIN 3.9   Recent Labs  Lab 01/11/20 2354  LIPASE 30   No results for input(s):  AMMONIA in the last 168 hours. Coagulation Profile: Recent Labs  Lab 01/12/20 0625  INR 1.5*   Cardiac Enzymes: No results for input(s): CKTOTAL, CKMB, CKMBINDEX, TROPONINI in the last 168 hours. BNP (last 3 results) No results for input(s): PROBNP in the last 8760 hours. HbA1C: No results for input(s): HGBA1C in the last 72 hours. CBG: No results for input(s): GLUCAP in the last 168 hours. Lipid Profile: No results for input(s): CHOL, HDL, LDLCALC, TRIG, CHOLHDL, LDLDIRECT in the last 72 hours. Thyroid Function Tests: Recent Labs    01/12/20 0625  TSH 2.483   Anemia Panel: Recent Labs    01/12/20 0625  VITAMINB12 203  FOLATE 10.9  FERRITIN 13  TIBC 425  IRON 16*  RETICCTPCT 1.9   Sepsis Labs: Recent Labs  Lab 01/12/20 0625  PROCALCITON <0.10    Recent Results (from the past 240 hour(s))  SARS Coronavirus 2 by RT PCR (hospital order, performed in [facility] hospital lab) Nasopharyngeal Nasopharyngeal Swab     Status: None   Collection Time: 01/12/20  3:30 AM   Specimen: Nasopharyngeal Swab  Result Value Ref Range Status   SARS Coronavirus 2 NEGATIVE NEGATIVE Final    Comment: (NOTE) SARS-CoV-2 target nucleic acids are NOT DETECTED.  The SARS-CoV-2 RNA is generally detectable in upper and lower respiratory specimens during the acute phase of infection. The lowest concentration of SARS-CoV-2 viral copies this assay can detect is 250 copies / mL. A negative result does not preclude SARS-CoV-2 infection and should not be used as the sole basis for treatment or other patient management decisions.  A negative result may occur with improper specimen collection / handling, submission of specimen other than nasopharyngeal swab, presence of viral mutation(s) within the areas targeted by this assay, and inadequate number of viral copies (<250 copies / mL). A negative result must be combined with clinical observations, patient history, and epidemiological  information.  Fact Sheet for Patients:   BoilerBrush.com.cyhttps://www.fda.gov/media/136312/download  Fact Sheet for Healthcare Providers: https://pope.com/https://www.fda.gov/media/136313/download  This test is not yet approved or  cleared by the [place] FDA and has been authorized for detection and/or diagnosis of SARS-CoV-2 by FDA under an Emergency Use Authorization (EUA).  This EUA will remain in effect (meaning this test can be used) for the duration of the COVID-19 declaration under Section 564(b)(1) of the Act, 21 U.S.C. section 360bbb-3(b)(1), unless the authorization is terminated or revoked sooner.  Performed at [facility], [address] [place], [place] [address]          Radiology Studies: DG Chest 2 View  Result Date: 01/12/2020 CLINICAL DATA:  49 year old female with chest pain. EXAM: CHEST - 2 VIEW COMPARISON:  None. FINDINGS: There is mild cardiomegaly with vascular congestion. The right lung base density, likely combination of pleural effusion and associated atelectasis or infiltrate. Underlying mass is not excluded. Clinical correlation and follow-up recommended. No pneumothorax. No acute osseous pathology. IMPRESSION: Cardiomegaly with mild vascular congestion and probable right pleural effusion. Pneumonia or mass not excluded. Clinical correlation and follow-up to resolution recommended. Electronically Signed   By: Elgie CollardArash  Radparvar M.D.   On: 01/12/2020 00:17   CT Angio Chest PE W and/or Wo Contrast  Result Date: 01/12/2020 CLINICAL DATA:  49 year old female with concern for pulmonary embolism. EXAM: CT ANGIOGRAPHY CHEST WITH CONTRAST TECHNIQUE: Multidetector CT imaging of the chest was performed using the standard protocol during bolus administration of intravenous contrast. Multiplanar CT image reconstructions and MIPs were obtained to evaluate the vascular anatomy. CONTRAST:  100mL OMNIPAQUE IOHEXOL 350 MG/ML SOLN COMPARISON:  Chest radiograph dated 01/12/2020.  FINDINGS: Evaluation of this exam is limited due to respiratory motion artifact. Cardiovascular: Top-normal cardiac size. No pericardial effusion. The thoracic aorta is unremarkable. Evaluation of the pulmonary arteries is very limited due to severe motion artifact. There is however apparent small filling defects in the left upper lobe pulmonary artery branch (71-70 3/7) as well as apparent nonocclusive linear defects in the right lower lobe pulmonary artery branch (57/7 and 129/5) concerning for nonocclusive age indeterminate pulmonary embolism. No large central pulmonary artery embolus identified. Mediastinum/Nodes: No definite hilar or mediastinal adenopathy. The esophagus is grossly unremarkable. No mediastinal fluid collection. Several mildly enlarged right cardiophrenic lymph nodes measure up to 11 mm in short axis. Lungs/Pleura: There is a large right pleural effusion with complete consolidative changes of the right lower lobe which may represent atelectasis or infiltrate. Underlying mass is not excluded clinical correlation and follow-up to resolution recommended. There is diffuse interstitial prominence which may represent edema. No pneumothorax. The central airways remain patent. Upper Abdomen: No acute abnormality.  Musculoskeletal: No chest wall abnormality. No acute or significant osseous findings. Review of the MIP images confirms the above findings. IMPRESSION: 1. Very limited study due to respiratory motion artifact. No large central pulmonary artery embolus. Artifact versus small nonocclusive age indeterminate peripheral pulmonary artery emboli. No CT evidence of right heart straining. 2. Large right pleural effusion with complete consolidative changes of the right lower lobe which may represent atelectasis or infiltrate. Underlying mass is not excluded. Clinical correlation and follow-up to resolution recommended. Thoracentesis may provide additional diagnostic value and symptomatic relief. 3.  Diffuse interstitial prominence may represent edema. 4. Mildly enlarged right cardiophrenic lymph nodes, likely reactive. These results were called by telephone at the time of interpretation on 01/12/2020 at 2:59 am to provider [facility] , who verbally acknowledged these results. Electronically Signed   By: Elgie Collard M.D.   On: 01/12/2020 02:58   US Abdomen Complete  Result Date: 01/12/2020 CLINICAL DATA:  Nausea, vomiting and diarrhea since Monday. EXAM: ABDOMEN ULTRASOUND COMPLETE COMPARISON:  Chest CT 01/12/2020 FINDINGS: Gallbladder: The gallbladder is mildly contracted. There are large shadowing gallstones. The largest measures 2.3 cm. Associated mild gallbladder wall thickening measuring a maximum of 3.5 mm. No pericholecystic fluid or sonographic Murphy sign Common bile duct: Diameter: 4.7 mm Liver: Normal echogenicity without focal lesion or biliary dilatation. Portal vein is patent on color Doppler imaging with normal direction of blood flow towards the liver. IVC: No abnormality visualized. Pancreas: Poorly visualized. Spleen: Normal size.  No focal lesions. Right Kidney: Length: 11.1 cm. Normal renal cortical thickness and echogenicity without focal lesions or hydronephrosis. Left Kidney: Length: 10.7 cm. Normal renal cortical thickness and echogenicity without focal lesions or hydronephrosis. Abdominal aorta: Unable to visualize. Other findings: Large pelvic mass extending up into the abdomen possibly enlarged fibroid uterus. Right pleural effusion. IMPRESSION: 1. Cholelithiasis with slightly contracted gallbladder and gallbladder wall thickening but no pericholecystic fluid or sonographic Murphy sign to suggest acute cholecystitis. 2. Poorly visualized pancreas and aorta. 3. Large pelvic mass extending up into the abdomen possibly enlarged fibroid uterus. Recommend CT abdomen pelvis with IV and oral contrast for further evaluation. 4. Right pleural effusion. Electronically Signed   By: Rudie Meyer M.D.   On: 01/12/2020 07:43        Scheduled Meds: . aspirin  325 mg Oral Daily  . metoprolol succinate  50 mg Oral Daily  . sodium chloride flush  3 mL Intravenous Once   Continuous Infusions:   LOS: 0 days    Time spent: 40 min    Azucena Fallen, DO [facility]  If 7PM-7AM, please contact night-coverage [email or web address]  01/12/2020, 8:29 AM

## 2020-01-12 NOTE — Procedures (Signed)
Thoracentesis  Procedure Note  Natalie Manning  [id number]  Apr 13, 1971  Date:01/12/20  Time:1:35 PM   Provider Performing:Pete E Tanja Port   Procedure: Thoracentesis with imaging guidance (38101)  Indication(s) Pleural Effusion  Consent Risks of the procedure as well as the alternatives and risks of each were explained to the patient and/or caregiver.  Consent for the procedure was obtained and is signed in the bedside chart  Anesthesia Topical only with 1% lidocaine    Time Out Verified patient identification, verified procedure, site/side was marked, verified correct patient position, special equipment/implants available, medications/allergies/relevant history reviewed, required imaging and test results available.   Sterile Technique Maximal sterile technique including full sterile barrier drape, hand hygiene, sterile gown, sterile gloves, mask, hair covering, sterile ultrasound probe cover (if used).  Procedure Description Ultrasound was used to identify appropriate pleural anatomy for placement and overlying skin marked.  Area of drainage cleaned and draped in sterile fashion. Lidocaine was used to anesthetize the skin and subcutaneous tissue.  1400 cc's of bloody/rusty  appearing fluid was drained from the right pleural space. Catheter then removed and bandaid applied to site.   Complications/Tolerance None; patient tolerated the procedure well. Chest X-ray is ordered to confirm no post-procedural complication.   EBL Minimal   Specimen(s) Pleural fluid Simonne Martinet ACNP-BC [facility] Pulmonary/Critical Care Pager # [phone] OR # [phone] if no answer

## 2020-01-13 LAB — LIPID PANEL
Cholesterol: 185 mg/dL (ref 0–200)
HDL: 47 mg/dL (ref >40)
LDL Cholesterol: 109 mg/dL — ABNORMAL HIGH (ref 0–99)
Total CHOL/HDL Ratio: 3.9 RATIO
Triglycerides: 143 mg/dL (ref <150)
VLDL: 29 mg/dL (ref 0–40)

## 2020-01-13 LAB — BASIC METABOLIC PANEL
Anion gap: 13 (ref 5–15)
BUN: 16 mg/dL (ref 6–20)
CO2: 23 mmol/L (ref 22–32)
Calcium: 9.2 mg/dL (ref 8.9–10.3)
Chloride: 103 mmol/L (ref 98–111)
Creatinine, Ser: 0.87 mg/dL (ref 0.44–1.00)
GFR calc Af Amer: >60 mL/min (ref >60)
GFR calc non Af Amer: >60 mL/min (ref >60)
Glucose, Bld: 111 mg/dL — ABNORMAL HIGH (ref 70–99)
Potassium: 4 mmol/L (ref 3.5–5.1)
Sodium: 139 mmol/L (ref 135–145)

## 2020-01-13 LAB — PATHOLOGIST SMEAR REVIEW

## 2020-01-13 LAB — PH, BODY FLUID: pH, Body Fluid: 7

## 2020-01-13 LAB — CBC
HCT: 35.5 % — ABNORMAL LOW (ref 36.0–46.0)
Hemoglobin: 10 g/dL — ABNORMAL LOW (ref 12.0–15.0)
MCH: 21.2 pg — ABNORMAL LOW (ref 26.0–34.0)
MCHC: 28.2 g/dL — ABNORMAL LOW (ref 30.0–36.0)
MCV: 75.2 fL — ABNORMAL LOW (ref 80.0–100.0)
Platelets: 199 10*3/uL (ref 150–400)
RBC: 4.72 MIL/uL (ref 3.87–5.11)
RDW: 20.2 % — ABNORMAL HIGH (ref 11.5–15.5)
WBC: 12.6 10*3/uL — ABNORMAL HIGH (ref 4.0–10.5)
nRBC: 0 % (ref 0.0–0.2)

## 2020-01-13 MED ORDER — ASPIRIN EC 81 MG PO TBEC: 81.0000 mg | DELAYED_RELEASE_TABLET | Freq: Every day | ORAL | Status: DC

## 2020-01-13 MED ORDER — ASPIRIN 81 MG PO TBEC: 81.0000 mg | DELAYED_RELEASE_TABLET | Freq: Every day | ORAL | 11 refills | Status: AC

## 2020-01-13 MED ORDER — FUROSEMIDE 20 MG PO TABS
20.0000 mg | ORAL_TABLET | Freq: Two times a day (BID) | ORAL | 0 refills | Status: DC
Start: 2020-01-13 — End: 2020-01-13

## 2020-01-13 MED ORDER — APIXABAN 5 MG PO TABS: 5.0000 mg | ORAL_TABLET | Freq: Two times a day (BID) | ORAL | 0 refills | Status: AC

## 2020-01-13 MED ORDER — FUROSEMIDE 20 MG PO TABS
20.0000 mg | ORAL_TABLET | Freq: Two times a day (BID) | ORAL | 0 refills | Status: AC
Start: 2020-01-13 — End: ?

## 2020-01-13 MED ORDER — APIXABAN 5 MG PO TABS
5.0000 mg | ORAL_TABLET | Freq: Two times a day (BID) | ORAL | Status: DC
  Administered 2020-01-13: 5 mg via ORAL
  Filled 2020-01-13: qty 1

## 2020-01-13 NOTE — Discharge Summary (Signed)
Physician Discharge Summary  Natalie Manning LZJ:[id number] DOB: 11-05-1970 DOA: 01/12/2020  PCP: Julieanne Manson, MD  Admit date: 01/12/2020 Discharge date: 01/13/2020  Admitted From: Home Disposition: Home  Recommendations for Outpatient Follow-up:  1. Follow up with PCP in 1-2 weeks 2. Please obtain BMP/CBC in one week  Discharge Condition: Stable CODE STATUS: Full Diet recommendation: As tolerated  Brief/Interim Summary: Natalie Manning a 49 y.o.femalewithhistory of A. fib diagnosed in 2019 since then patient has been on flecainide and metoprolol has been recently experiencing bilateral lower extremity edema about 2 weeks ago when patient's primary care physician placed patient on Lasix. About 4 days ago patient had COVID-19 vaccination following which 4 hours later patient developed nausea vomiting and diarrhea. This resolved the following day. Then yesterday patient started having nausea and shortness of breath with minimal exertion. Denies chest pain fever chills abdominal pain. Patient is lower extremity edema improved with Lasix but left lower extremity swelling persisted. In the ER patient was afebrile. Gets short of breath on exertion.EKG shows normal sinus rhythm.High sensitive troponin was 231 and 168 and BNP was 47.3. WBC count was 12 hemoglobin 10.1. No old labs to compare. Patient had a CT angiogram of the chest which was not showing any definite pulmonary embolism but showing large right-sided pleural effusion for which radiologist recommended thoracentesis. On exam patient has left lower extremity edema extending up to the knees. Abdomen appears benign on exam. Patient admitted for acute respiratory failure with hypoxia with large right-sided pleural effusion.  Patient admitted as above with acute respiratory failure without hypoxia, noted to have large pleural effusion on the right side status post thoracentesis with PCCM.  Pleural fluid  concerning for metastatic or malignant process.  Echo unremarkable for any acute findings, patient's lower extremity edema and weight decreased appropriately with IV diuretics.  Patient otherwise was noted to have questionable PE on initial CTA at intake, repeat CTA did again show questionable subsegmental PE which appears to be acute in the setting of A. fib with CHA2DS2-VASc of 2 as well as mass noted on abdomen on multiple images patient is at high risk for thrombotic event, as such patient was placed on Eliquis at discharge for prophylaxis.  Patient otherwise has close follow-up with pulmonology in the next few weeks, she will be traveling back to her home state of [place] to follow with PCP there in the next 3 to 5 days with subsequent follow-up back here when she returns in the next 2 weeks.  Discharge Diagnoses:  Principal Problem:   Acute respiratory failure with hypoxia (HCC) Active Problems:   Atrial fibrillation (HCC)   Pleural effusion   Normocytic anemia   Pleural effusion on right   Discharge Instructions  Discharge Instructions    Call MD for:  difficulty breathing, headache or visual disturbances   Complete by: As directed    Call MD for:  extreme fatigue   Complete by: As directed    Call MD for:  hives   Complete by: As directed    Call MD for:  persistant dizziness or light-headedness   Complete by: As directed    Call MD for:  persistant nausea and vomiting   Complete by: As directed    Call MD for:  severe uncontrolled pain   Complete by: As directed    Call MD for:  temperature >100.4   Complete by: As directed    Diet - low sodium heart healthy   Complete by: As directed  Increase activity slowly   Complete by: As directed      Allergies as of 01/13/2020   No Known Allergies     Medication List    STOP taking these medications   aspirin 325 MG tablet Replaced by: aspirin 81 MG EC tablet   flecainide 100 MG tablet Commonly known as: TAMBOCOR      TAKE these medications   apixaban 5 MG Tabs tablet Commonly known as: ELIQUIS Take 1 tablet (5 mg total) by mouth 2 (two) times daily.   aspirin 81 MG EC tablet Take 1 tablet (81 mg total) by mouth daily. Swallow whole. Start taking on: January 14, 2020 Replaces: aspirin 325 MG tablet   furosemide 20 MG tablet Commonly known as: LASIX Take 1 tablet (20 mg total) by mouth 2 (two) times daily. 1 tab by mouth in morning for 2 days and then as needed for edema What changed:   how much to take  how to take this  when to take this   metoprolol succinate 50 MG 24 hr tablet Commonly known as: TOPROL-XL Take 1 tablet (50 mg total) by mouth daily. Take with or immediately following a meal.       Follow-up Information    Marykay Lex, MD Follow up.   Specialty: Cardiology Why: [facility] - keep your previously scheduled appointment on Thursday February 09, 2020 at 1:20 PM with Dr. Herbie Baltimore. He is one of the physicians that works with the cardiology group you saw in the hospital. Contact information: [address] [place] [address] [phone]        Julio Sicks, NP Follow up on 02/06/2020.   Specialty: Pulmonary Disease Why: 9 am for chest x ray then 930 with Tammy  Contact information: [address] [place] [address] [phone]              No Known Allergies  Consultations:  PCCM   Procedures/Studies: DG Chest 2 View  Result Date: 01/12/2020 CLINICAL DATA:  49 year old female with chest pain. EXAM: CHEST - 2 VIEW COMPARISON:  None. FINDINGS: There is mild cardiomegaly with vascular congestion. The right lung base density, likely combination of pleural effusion and associated atelectasis or infiltrate. Underlying mass is not excluded. Clinical correlation and follow-up recommended. No pneumothorax. No acute osseous pathology. IMPRESSION: Cardiomegaly with mild vascular congestion and probable right pleural effusion.  Pneumonia or mass not excluded. Clinical correlation and follow-up to resolution recommended. Electronically Signed   By: Elgie Collard M.D.   On: 01/12/2020 00:17   CT ANGIO CHEST PE W OR WO CONTRAST  Addendum Date: 01/12/2020   ADDENDUM REPORT: 01/12/2020 16:22 ADDENDUM: These results will be called to the ordering clinician or representative by the Radiologist Assistant, and communication documented in the PACS or [employer]. Electronically Signed   By: Katherine Mantle M.D.   On: 01/12/2020 16:22   Result Date: 01/12/2020 CLINICAL DATA:  Shortness of breath. EXAM: CT ANGIOGRAPHY CHEST WITH CONTRAST TECHNIQUE: Multidetector CT imaging of the chest was performed using the standard protocol during bolus administration of intravenous contrast. Multiplanar CT image reconstructions and MIPs were obtained to evaluate the vascular anatomy. CONTRAST:  OMNIPAQUE IOHEXOL 350 MG/ML SOLN COMPARISON:  January 12, 2020 FINDINGS: Cardiovascular: Evaluation is significantly limited by respiratory motion artifact and patient body habitus. There is no large centrally located pulmonary embolism. Findings are suspicious for segmental and subsegmental pulmonary emboli involving the bilateral lower lobes, perhaps most evident  in the right lower lobe (axial series 5, image 131). The heart size is mildly enlarged. There is no evidence for thoracic aortic aneurysm or dissection. There is no large pericardial effusion. Mediastinum/Nodes: --there are no pathologically enlarged mediastinal lymph nodes. -- No hilar lymphadenopathy. -- No axillary lymphadenopathy. -- No supraclavicular lymphadenopathy. -- Normal thyroid gland where visualized. -  Unremarkable esophagus. Lungs/Pleura: The right-sided pleural effusion has significantly decreased in size since the prior study. There is a small residual right-sided pleural effusion. There is no pneumothorax. There is some atelectasis at the right lung base. The trachea is  unremarkable. Upper Abdomen: Contrast bolus timing is not optimized for evaluation of the abdominal organs. There is a partially visualized mass extending into the right upper quadrant measuring at least 19.2 x 12.1 cm. There is infiltration of the omentum in the right upper quadrant. There is cholelithiasis without definite CT evidence for acute cholecystitis. There are enlarged pre pericardial and parasternal lymph nodes measuring up to approximately 1.1 cm in the short axis (axial series 4, image 50). There is a small amount of free fluid in the upper abdomen. Musculoskeletal: No chest wall abnormality. No bony spinal canal stenosis. Review of the MIP images confirms the above findings. IMPRESSION: 1. Evaluation is again severely limited by patient body habitus and respiratory motion artifact. There is no large centrally located pulmonary embolism. However, findings are again concerning for segmental and subsegmental pulmonary emboli as detailed above. 2. Large, partially visualized mass in the upper abdomen. Further evaluation with a contrast enhanced CT of the abdomen and pelvis is recommended. 3. Significant interval decrease in the previously demonstrated right-sided pleural effusion. There is a small residual right-sided pleural effusion with adjacent atelectasis. 4. Trace abdominal ascites. 5. There is cholelithiasis without secondary signs of acute cholecystitis. 6. Mild cardiomegaly. 7. Enlarged pericardial lymph nodes of unknown clinical significance. Electronically Signed: By: Katherine Mantle M.D. On: 01/12/2020 16:16   CT Angio Chest PE W and/or Wo Contrast  Result Date: 01/12/2020 CLINICAL DATA:  49 year old female with concern for pulmonary embolism. EXAM: CT ANGIOGRAPHY CHEST WITH CONTRAST TECHNIQUE: Multidetector CT imaging of the chest was performed using the standard protocol during bolus administration of intravenous contrast. Multiplanar CT image reconstructions and MIPs were obtained to  evaluate the vascular anatomy. CONTRAST:  OMNIPAQUE IOHEXOL 350 MG/ML SOLN COMPARISON:  Chest radiograph dated 01/12/2020. FINDINGS: Evaluation of this exam is limited due to respiratory motion artifact. Cardiovascular: Top-normal cardiac size. No pericardial effusion. The thoracic aorta is unremarkable. Evaluation of the pulmonary arteries is very limited due to severe motion artifact. There is however apparent small filling defects in the left upper lobe pulmonary artery branch (71-70 3/7) as well as apparent nonocclusive linear defects in the right lower lobe pulmonary artery branch (57/7 and 129/5) concerning for nonocclusive age indeterminate pulmonary embolism. No large central pulmonary artery embolus identified. Mediastinum/Nodes: No definite hilar or mediastinal adenopathy. The esophagus is grossly unremarkable. No mediastinal fluid collection. Several mildly enlarged right cardiophrenic lymph nodes measure up to 11 mm in short axis. Lungs/Pleura: There is a large right pleural effusion with complete consolidative changes of the right lower lobe which may represent atelectasis or infiltrate. Underlying mass is not excluded clinical correlation and follow-up to resolution recommended. There is diffuse interstitial prominence which may represent edema. No pneumothorax. The central airways remain patent. Upper Abdomen: No acute abnormality. Musculoskeletal: No chest wall abnormality. No acute or significant osseous findings. Review of the MIP images confirms the above findings.  IMPRESSION: 1. Very limited study due to respiratory motion artifact. No large central pulmonary artery embolus. Artifact versus small nonocclusive age indeterminate peripheral pulmonary artery emboli. No CT evidence of right heart straining. 2. Large right pleural effusion with complete consolidative changes of the right lower lobe which may represent atelectasis or infiltrate. Underlying mass is not excluded. Clinical  correlation and follow-up to resolution recommended. Thoracentesis may provide additional diagnostic value and symptomatic relief. 3. Diffuse interstitial prominence may represent edema. 4. Mildly enlarged right cardiophrenic lymph nodes, likely reactive. These results were called by telephone at the time of interpretation on 01/12/2020 at 2:59 am to provider [facility] , who verbally acknowledged these results. Electronically Signed   By: Elgie Collard M.D.   On: 01/12/2020 02:58   US Abdomen Complete  Result Date: 01/12/2020 CLINICAL DATA:  Nausea, vomiting and diarrhea since Monday. EXAM: ABDOMEN ULTRASOUND COMPLETE COMPARISON:  Chest CT 01/12/2020 FINDINGS: Gallbladder: The gallbladder is mildly contracted. There are large shadowing gallstones. The largest measures 2.3 cm. Associated mild gallbladder wall thickening measuring a maximum of 3.5 mm. No pericholecystic fluid or sonographic Murphy sign Common bile duct: Diameter: 4.7 mm Liver: Normal echogenicity without focal lesion or biliary dilatation. Portal vein is patent on color Doppler imaging with normal direction of blood flow towards the liver. IVC: No abnormality visualized. Pancreas: Poorly visualized. Spleen: Normal size.  No focal lesions. Right Kidney: Length: 11.1 cm. Normal renal cortical thickness and echogenicity without focal lesions or hydronephrosis. Left Kidney: Length: 10.7 cm. Normal renal cortical thickness and echogenicity without focal lesions or hydronephrosis. Abdominal aorta: Unable to visualize. Other findings: Large pelvic mass extending up into the abdomen possibly enlarged fibroid uterus. Right pleural effusion. IMPRESSION: 1. Cholelithiasis with slightly contracted gallbladder and gallbladder wall thickening but no pericholecystic fluid or sonographic Murphy sign to suggest acute cholecystitis. 2. Poorly visualized pancreas and aorta. 3. Large pelvic mass extending up into the abdomen possibly enlarged fibroid uterus.  Recommend CT abdomen pelvis with IV and oral contrast for further evaluation. 4. Right pleural effusion. Electronically Signed   By: Rudie Meyer M.D.   On: 01/12/2020 07:43   DG Chest Port 1 View  Result Date: 01/12/2020 CLINICAL DATA:  Status post right-sided thoracentesis EXAM: PORTABLE CHEST 1 VIEW COMPARISON:  January 12, 2020 FINDINGS: There has been significant diminution in size of right pleural effusion following thoracentesis. No pneumothorax. No edema or airspace opacity. Heart is upper normal in size with pulmonary vascularity normal. No adenopathy. No bone lesions. IMPRESSION: Significant diminution in size of right pleural effusion following thoracentesis. No pneumothorax. No edema or airspace opacity. Heart upper normal in size, stable. Electronically Signed   By: Bretta Bang III M.D.   On: 01/12/2020 13:31   ECHOCARDIOGRAM COMPLETE  Result Date: 01/12/2020    ECHOCARDIOGRAM REPORT   Patient Name:   [facility] Date of Exam: 01/12/2020 Medical Rec #:  [id number]              Height:       66.0 in Accession #:    [id number]             Weight:       290.0 lb Date of Birth:  22-Jun-1971              BSA:          2.342 m Patient Age:    49 years  BP:           131/78 mmHg Patient Gender: F                      HR:           89 bpm. Exam Location:  Inpatient Procedure: 2D Echo, Cardiac Doppler and Color Doppler Indications:    Dyspnea 786.09 / R06.00  History:        Patient has no prior history of Echocardiogram examinations.                 Arrythmias:Atrial Fibrillation; Risk Factors:Hypertension.  Sonographer:    Elmarie Shiley Dance Referring Phys: [id number] ARSHAD N KAKRAKANDY IMPRESSIONS  1. Left ventricular ejection fraction, by estimation, is 65 to 70%. The left ventricle has normal function. The left ventricle has no regional wall motion abnormalities. There is moderate left ventricular hypertrophy. Left ventricular diastolic parameters were normal.  2. Right ventricular  systolic function is normal. The right ventricular size is normal. Tricuspid regurgitation signal is inadequate for assessing PA pressure.  3. The mitral valve is normal in structure. No evidence of mitral valve regurgitation.  4. The aortic valve was not well visualized. Aortic valve regurgitation is trivial. No aortic stenosis is present.  5. The inferior vena cava is normal in size with greater than 50% respiratory variability, suggesting right atrial pressure of 3 mmHg. FINDINGS  Left Ventricle: Left ventricular ejection fraction, by estimation, is 65 to 70%. The left ventricle has normal function. The left ventricle has no regional wall motion abnormalities. The left ventricular internal cavity size was small. There is moderate  left ventricular hypertrophy. Left ventricular diastolic parameters were normal. Right Ventricle: The right ventricular size is normal. Right vetricular wall thickness was not assessed. Right ventricular systolic function is normal. Tricuspid regurgitation signal is inadequate for assessing PA pressure. Left Atrium: Left atrial size was normal in size. Right Atrium: Right atrial size was normal in size. Pericardium: There is no evidence of pericardial effusion. Mitral Valve: The mitral valve is normal in structure. There is moderate thickening of the mitral valve leaflet(s). No evidence of mitral valve regurgitation. Tricuspid Valve: The tricuspid valve is normal in structure. Tricuspid valve regurgitation is trivial. Aortic Valve: The aortic valve was not well visualized. Aortic valve regurgitation is trivial. No aortic stenosis is present. Pulmonic Valve: The pulmonic valve was not well visualized. Pulmonic valve regurgitation is not visualized. Aorta: The aortic root and ascending aorta are structurally normal, with no evidence of dilitation. Venous: The inferior vena cava is normal in size with greater than 50% respiratory variability, suggesting right atrial pressure of 3 mmHg.  IAS/Shunts: The interatrial septum was not well visualized.  LEFT VENTRICLE PLAX 2D LVIDd:         3.40 cm  Diastology LVIDs:         2.50 cm  LV e' lateral:   11.30 cm/s LV PW:         1.50 cm  LV E/e' lateral: 6.3 LV IVS:        1.10 cm  LV e' medial:    6.42 cm/s LVOT diam:     2.10 cm  LV E/e' medial:  11.0 LV SV:         64 LV SV Index:   27 LVOT Area:     3.46 cm  RIGHT VENTRICLE             IVC RV Basal diam:  3.00 cm  IVC diam: 1.60 cm RV Mid diam:    2.20 cm RV S prime:     16.00 cm/s TAPSE (M-mode): 1.7 cm LEFT ATRIUM             Index       RIGHT ATRIUM          Index LA diam:        5.00 cm 2.14 cm/m  RA Area:     8.40 cm LA Vol (A2C):   56.0 ml 23.91 ml/m RA Volume:   14.30 ml 6.11 ml/m LA Vol (A4C):   56.2 ml 24.00 ml/m LA Biplane Vol: 59.7 ml 25.49 ml/m  AORTIC VALVE LVOT Vmax:   102.00 cm/s LVOT Vmean:  69.300 cm/s LVOT VTI:    0.184 m  AORTA Ao Root diam: 3.50 cm Ao Asc diam:  3.40 cm MITRAL VALVE MV Area (PHT): 3.65 cm    SHUNTS MV Decel Time: 208 msec    Systemic VTI:  0.18 m MV E velocity: 70.80 cm/s  Systemic Diam: 2.10 cm MV A velocity: 74.20 cm/s MV E/A ratio:  0.95 Epifanio Lesches MD Electronically signed by Epifanio Lesches MD Signature Date/Time: 01/12/2020/11:49:24 AM    Final    VAS [place] LOWER EXTREMITY VENOUS (DVT)  Result Date: 01/12/2020  Lower Venous DVTStudy Indications: Swelling.  Limitations: Poor ultrasound/tissue interface. Comparison Study: No prior study Performing Technologist: Gertie Fey MHA, RDMS, RVT, RDCS  Examination Guidelines: A complete evaluation includes B-mode imaging, spectral Doppler, color Doppler, and power Doppler as needed of all accessible portions of each vessel. Bilateral testing is considered an integral part of a complete examination. Limited examinations for reoccurring indications may be performed as noted. The reflux portion of the exam is performed with the patient in reverse Trendelenburg.   +-----+---------------+---------+-----------+----------+--------------+ RIGHTCompressibilityPhasicitySpontaneityPropertiesThrombus Aging +-----+---------------+---------+-----------+----------+--------------+ CFV  Full           Yes      Yes                                 +-----+---------------+---------+-----------+----------+--------------+   +---------+---------------+---------+-----------+----------+--------------+ LEFT     CompressibilityPhasicitySpontaneityPropertiesThrombus Aging +---------+---------------+---------+-----------+----------+--------------+ CFV      Full           Yes      Yes                                 +---------+---------------+---------+-----------+----------+--------------+ SFJ      Full                                                        +---------+---------------+---------+-----------+----------+--------------+ FV Prox  Full                                                        +---------+---------------+---------+-----------+----------+--------------+ FV Mid   Full                                                        +---------+---------------+---------+-----------+----------+--------------+  FV DistalFull                                                        +---------+---------------+---------+-----------+----------+--------------+ PFV      Full                                                        +---------+---------------+---------+-----------+----------+--------------+ POP      Full           Yes      Yes                                 +---------+---------------+---------+-----------+----------+--------------+ PTV      Full                                                        +---------+---------------+---------+-----------+----------+--------------+   Left Technical Findings: Not visualized segments include peroneal veins.   Summary: RIGHT: - No evidence of common femoral vein obstruction.   LEFT: - There is no evidence of deep vein thrombosis in the lower extremity. However, portions of this examination were limited- see technologist comments above.  - No cystic structure found in the popliteal fossa.  *See table(s) above for measurements and observations.    Preliminary      Subjective: No acute issues or events overnight, edema swelling and shortness of breath resolved, denies headache, fever, chills, nausea, vomiting, diarrhea, constipation.   Discharge Exam: Vitals:   01/13/20 0600 01/13/20 1226  BP:  125/80  Pulse: 89 81  Resp: 20 18  Temp:  98.4 F (36.9 C)  SpO2: 97% 96%   Vitals:   01/13/20 0417 01/13/20 0500 01/13/20 0600 01/13/20 1226  BP: (!) 148/83   125/80  Pulse: 86  89 81  Resp: 19  20 18   Temp: 98.5 F (36.9 C)   98.4 F (36.9 C)  TempSrc: Oral   Oral  SpO2: (!) 89%  97% 96%  Weight:  126.6 kg    Height:        General: Pt is alert, awake, not in acute distress Cardiovascular: Irregularly irregular, S1/S2 +, no rubs, no gallops Respiratory: CTA bilaterally, no wheezing, no rhonchi Abdominal: Soft, NT, ND, bowel sounds + Extremities: 1+ pitting edema bilateral lower extremities, no cyanosis    The results of significant diagnostics from this hospitalization (including imaging, microbiology, ancillary and laboratory) are listed below for reference.     Microbiology: Recent Results (from the past 240 hour(s))  SARS Coronavirus 2 by RT PCR (hospital order, performed in [facility] hospital lab) Nasopharyngeal Nasopharyngeal Swab     Status: None   Collection Time: 01/12/20  3:30 AM   Specimen: Nasopharyngeal Swab  Result Value Ref Range Status   SARS Coronavirus 2 NEGATIVE NEGATIVE Final    Comment: (NOTE) SARS-CoV-2 target nucleic acids are NOT DETECTED.  The SARS-CoV-2 RNA is generally detectable in upper and lower respiratory specimens during the  acute phase of infection. The lowest concentration of SARS-CoV-2 viral copies this assay  can detect is 250 copies / mL. A negative result does not preclude SARS-CoV-2 infection and should not be used as the sole basis for treatment or other patient management decisions.  A negative result may occur with improper specimen collection / handling, submission of specimen other than nasopharyngeal swab, presence of viral mutation(s) within the areas targeted by this assay, and inadequate number of viral copies (<250 copies / mL). A negative result must be combined with clinical observations, patient history, and epidemiological information.  Fact Sheet for Patients:   BoilerBrush.com.cy  Fact Sheet for Healthcare Providers: https://pope.com/  This test is not yet approved or  cleared by the [place] FDA and has been authorized for detection and/or diagnosis of SARS-CoV-2 by FDA under an Emergency Use Authorization (EUA).  This EUA will remain in effect (meaning this test can be used) for the duration of the COVID-19 declaration under Section 564(b)(1) of the Act, 21 U.S.C. section 360bbb-3(b)(1), unless the authorization is terminated or revoked sooner.  Performed at [facility], [address] [place], [place] [address]   Body fluid culture     Status: None (Preliminary result)   Collection Time: 01/12/20 12:50 PM   Specimen: Pleura; Body Fluid  Result Value Ref Range Status   Specimen Description   Final    PLEURAL Performed at [facility], [address] [place], [place] [address]    Special Requests   Final    NONE Performed at [facility], [address], [place], [place] [address]    Gram Stain   Final    FEW WBC PRESENT, PREDOMINANTLY MONONUCLEAR NO ORGANISMS SEEN    Culture   Final    NO GROWTH < 24 HOURS Performed at [facility], [address], [place], [place] [address]    Report Status PENDING  Incomplete     Labs: BNP (last 3  results) Recent Labs    01/11/20 2354  BNP 47.3   Basic Metabolic Panel: Recent Labs  Lab 01/11/20 2354 01/12/20 0625 01/13/20 0425  NA 140  --  139  K 3.7  --  4.0  CL 105  --  103  CO2 23  --  23  GLUCOSE 113*  --  111*  BUN 16  --  16  CREATININE 0.89  --  0.87  CALCIUM 9.3  --  9.2  MG  --  2.1  --    Liver Function Tests: Recent Labs  Lab 01/12/20 0625 01/12/20 1347  AST 21  --   ALT 14  --   ALKPHOS 46  --   BILITOT 0.5  --   PROT 8.0 8.3*  ALBUMIN 3.9  --    Recent Labs  Lab 01/11/20 2354  LIPASE 30   No results for input(s): AMMONIA in the last 168 hours. CBC: Recent Labs  Lab 01/11/20 2354 01/13/20 0425  WBC 12.0* 12.6*  HGB 10.1* 10.0*  HCT 34.4* 35.5*  MCV 74.9* 75.2*  PLT 224 199   Cardiac Enzymes: No results for input(s): CKTOTAL, CKMB, CKMBINDEX, TROPONINI in the last 168 hours. BNP: Invalid input(s): POCBNP CBG: No results for input(s): GLUCAP in the last 168 hours. D-Dimer No results for input(s): DDIMER in the last 72 hours. Hgb A1c No results for input(s): HGBA1C in the last 72 hours. Lipid Profile Recent Labs    01/12/20 1347 01/13/20 0425  CHOL 200 185  HDL  --  47  LDLCALC  --  161*  TRIG  --  143  CHOLHDL  --  3.9   Thyroid function studies Recent Labs    01/12/20 0625  TSH 2.483   Anemia work up Recent Labs    01/12/20 0625  VITAMINB12 203  FOLATE 10.9  FERRITIN 13  TIBC 425  IRON 16*  RETICCTPCT 1.9   Urinalysis    Component Value Date/Time   COLORURINE YELLOW 01/12/2020 0643   APPEARANCEUR CLEAR 01/12/2020 0643   LABSPEC 1.010 01/12/2020 0643   PHURINE 5.0 01/12/2020 0643   GLUCOSEU NEGATIVE 01/12/2020 0643   HGBUR SMALL (A) 01/12/2020 0643   BILIRUBINUR NEGATIVE 01/12/2020 0643   KETONESUR NEGATIVE 01/12/2020 0643   PROTEINUR NEGATIVE 01/12/2020 0643   NITRITE NEGATIVE 01/12/2020 0643   LEUKOCYTESUR NEGATIVE 01/12/2020 0643   Sepsis Labs Invalid input(s): PROCALCITONIN,  WBC,   LACTICIDVEN Microbiology Recent Results (from the past 240 hour(s))  SARS Coronavirus 2 by RT PCR (hospital order, performed in [facility] hospital lab) Nasopharyngeal Nasopharyngeal Swab     Status: None   Collection Time: 01/12/20  3:30 AM   Specimen: Nasopharyngeal Swab  Result Value Ref Range Status   SARS Coronavirus 2 NEGATIVE NEGATIVE Final    Comment: (NOTE) SARS-CoV-2 target nucleic acids are NOT DETECTED.  The SARS-CoV-2 RNA is generally detectable in upper and lower respiratory specimens during the acute phase of infection. The lowest concentration of SARS-CoV-2 viral copies this assay can detect is 250 copies / mL. A negative result does not preclude SARS-CoV-2 infection and should not be used as the sole basis for treatment or other patient management decisions.  A negative result may occur with improper specimen collection / handling, submission of specimen other than nasopharyngeal swab, presence of viral mutation(s) within the areas targeted by this assay, and inadequate number of viral copies (<250 copies / mL). A negative result must be combined with clinical observations, patient history, and epidemiological information.  Fact Sheet for Patients:   BoilerBrush.com.cy  Fact Sheet for Healthcare Providers: https://pope.com/  This test is not yet approved or  cleared by the [place] FDA and has been authorized for detection and/or diagnosis of SARS-CoV-2 by FDA under an Emergency Use Authorization (EUA).  This EUA will remain in effect (meaning this test can be used) for the duration of the COVID-19 declaration under Section 564(b)(1) of the Act, 21 U.S.C. section 360bbb-3(b)(1), unless the authorization is terminated or revoked sooner.  Performed at [facility], [address] [place], [place] [address]   Body fluid culture     Status: None (Preliminary result)   Collection Time:  01/12/20 12:50 PM   Specimen: Pleura; Body Fluid  Result Value Ref Range Status   Specimen Description   Final    PLEURAL Performed at [facility], [address], [place], [place] [address]    Special Requests   Final    NONE Performed at [facility], [address] [place], [place] [address]    Gram Stain   Final    FEW WBC PRESENT, PREDOMINANTLY MONONUCLEAR NO ORGANISMS SEEN    Culture   Final    NO GROWTH < 24 HOURS Performed at [facility], [address], [place], [place] [address]    Report Status PENDING  Incomplete     Time coordinating discharge: Over 30 minutes  SIGNED:   Azucena Fallen, DO [facility] 01/13/2020, 3:39 PM Pager   If 7PM-7AM, please contact night-coverage [email or web address]

## 2020-01-13 NOTE — Discharge Instructions (Signed)
Pleural Effusion Pleural effusion is an abnormal buildup of fluid in the layers of tissue between the lungs and the inside of the chest (pleural space) The two layers of tissue that line the lungs and the inside of the chest are called pleura. Usually, there is no air in the space between the pleura, only a thin layer of fluid. Some conditions can cause a large amount of fluid to build up, which can cause the lung to collapse if untreated. A pleural effusion is usually caused by another disease that requires treatment. What are the causes? Pleural effusion can be caused by:  Heart failure.  Certain infections, such as pneumonia or tuberculosis.  Cancer.  A blood clot in the lung (pulmonary embolism).  Complications from surgery, such as from open heart surgery.  Liver disease (cirrhosis).  Kidney disease. What are the signs or symptoms? In some cases, pleural effusion may cause no symptoms. If symptoms are present, they may include:  Shortness of breath, especially when lying down.  Chest pain. This may get worse when taking a deep breath.  Fever.  Dry, long-lasting (chronic) cough.  Hiccups.  Rapid breathing. An underlying condition that is causing the pleural effusion (such as heart failure, pneumonia, blood clots, tuberculosis, or cancer) may also cause other symptoms. How is this diagnosed? This condition may be diagnosed based on:  Your symptoms and medical history.  A physical exam.  A chest X-ray.  A procedure to use a needle to remove fluid from the pleural space (thoracentesis). This fluid is tested.  Other imaging studies of the chest, such as ultrasound or CT scan. How is this treated? Depending on the cause of your condition, treatment may include:  Treating the underlying condition that is causing the effusion. When that condition improves, the effusion will also improve. Examples of treatment for underlying conditions include: ? Antibiotic medicines  to treat an infection. ? Diuretics or other heart medicines to treat heart failure.  Thoracentesis.  Placing a thin flexible tube under your skin and into your chest to continuously drain the effusion (indwelling pleural catheter).  Surgery to remove the outer layer of tissue from the pleural space (decortication).  A procedure to put medicine into the chest cavity to seal the pleural space and prevent fluid buildup (pleurodesis).  Chemotherapy and radiation therapy, if you have cancerous (malignant) pleural effusion. These treatments are typically used to treat cancer. They kill certain cells in the body. Follow these instructions at home:  Take over-the-counter and prescription medicines only as told by your health care provider.  Ask your health care provider what activities are safe for you.  Keep track of how long you are able to do mild exercise (such as walking) before you get short of breath. Write down this information to share with your health care provider. Your ability to exercise should improve over time.  Do not use any products that contain nicotine or tobacco, such as cigarettes and e-cigarettes. If you need help quitting, ask your health care provider.  Keep all follow-up visits as told by your health care provider. This is important. Contact a health care provider if:  The amount of time that you are able to do mild exercise: ? Decreases. ? Does not improve with time.  You have a fever. Get help right away if:  You are short of breath.  You develop chest pain.  You develop a new cough. Summary  Pleural effusion is an abnormal buildup of fluid in the  layers of tissue between the lungs and the inside of the chest.  Pleural effusion can have many causes, including heart failure, pulmonary embolism, infections, or cancer.  Symptoms of pleural effusion can include shortness of breath, chest pain, fever, long-lasting (chronic) cough, hiccups, or rapid  breathing.  Diagnosis often involves making images of the chest (such as with ultrasound or X-ray) and removing fluid (thoracentesis) to send for testing.  Treatment for pleural effusion depends on what underlying condition is causing it. This information is not intended to replace advice given to you by your health care provider. Make sure you discuss any questions you have with your health care provider.  IF YOU GET SHORT OF BREATH AGAIN GO TO EMERGENCY ROOM  IF YOU RETURN HOME BEFORE YOUR SCHEDULED APPOINTMENT WITH PULMONARY PLEASE CALL THE OFFICE SO WE CAN TRY TO RESCHEDULE YOU EARLIER Document Revised: 05/29/2017 Document Reviewed: 02/19/2017 Elsevier Patient Education  2020 [employer]. Information on my medicine - ELIQUIS (apixaban)  This medication education was reviewed with me or my healthcare representative as part of my discharge preparation.    Why was Eliquis prescribed for you? Eliquis was prescribed for you to reduce the risk of a blood clot forming that can cause a stroke if you have a medical condition called atrial fibrillation (a type of irregular heartbeat).  What do You need to know about Eliquis ? Take your Eliquis TWICE DAILY - one tablet in the morning and one tablet in the evening with or without food. If you have difficulty swallowing the tablet whole please discuss with your pharmacist how to take the medication safely.  Take Eliquis exactly as prescribed by your doctor and DO NOT stop taking Eliquis without talking to the doctor who prescribed the medication.  Stopping may increase your risk of developing a stroke.  Refill your prescription before you run out.  After discharge, you should have regular check-up appointments with your healthcare provider that is prescribing your Eliquis.  In the future your dose may need to be changed if your kidney function or weight changes by a significant amount or as you get older.  What do you do if you miss a  dose? If you miss a dose, take it as soon as you remember on the same day and resume taking twice daily.  Do not take more than one dose of ELIQUIS at the same time to make up a missed dose.  Important Safety Information A possible side effect of Eliquis is bleeding. You should call your healthcare provider right away if you experience any of the following: ? Bleeding from an injury or your nose that does not stop. ? Unusual colored urine (red or dark brown) or unusual colored stools (red or black). ? Unusual bruising for unknown reasons. ? A serious fall or if you hit your head (even if there is no bleeding).  Some medicines may interact with Eliquis and might increase your risk of bleeding or clotting while on Eliquis. To help avoid this, consult your healthcare provider or pharmacist prior to using any new prescription or non-prescription medications, including herbals, vitamins, non-steroidal anti-inflammatory drugs (NSAIDs) and supplements.  This website has more information on Eliquis (apixaban): http://www.eliquis.com/eliquis/home

## 2020-01-13 NOTE — Progress Notes (Signed)
NAME:  Natalie Manning, MRN:  [id number], DOB:  Nov 12, 1970, LOS: 1 ADMISSION DATE:  01/12/2020, CONSULTATION DATE:  7/15 REFERRING MD:  Toniann Fail, CHIEF COMPLAINT:  Acute respiratory failure and pleural effusion   Brief History   49 year old female admitted 7/15 w/ cc: shortness of breath. Initially being worked up for possible PE given L>R LE edema. CT was neg for PE BUT did show large right pleural effusion w/ associated consolidative changes. Was also hypoxic so PCCM asked to assist w/ further evaluation.   Past Medical History  Atrial fibrillation on flecainide and metoprolol, CHA2DS2-VASc score 2.  On aspirin Recent lower extremity edema. Hypertension Obesity  Significant Hospital Events   7/15 admitted  Consults:  pulm  Procedures:  Right diagnostic and therapeutic thora 7/15 (pending)  Significant Diagnostic Tests:  CT chest 7/15: 1. Very limited study due to respiratory motion artifact. No large central pulmonary artery embolus. Artifact versus small nonocclusive age indeterminate peripheral pulmonary artery emboli. No CT evidence of right heart straining. 2. Large right pleural effusion with complete consolidative changes of the right lower lobe which may represent atelectasis or infiltrate. Underlying mass is not excluded. 3. Diffuse interstitial prominence may represent edema. 4. Mildly enlarged right cardiophrenic lymph nodes, likely reactive. ECHO 7/15>>>Estimated left ventricular ejection fraction 65 to 70% with normal LV function diastolic parameters normal Lower extremity ultrasound 7/16: Negative for DVT Pleural fluid analysis from right thoracentesis on 7/15: Culture pending Pleural LDH 1914 Pleural protein 7 pH pending Pathology smear pending Cell count: Fluid color red, appearance cloudy, total nucleated cells 5957, neutrophils 11, lymphocytes 79 Micro Data:  Pleural fluid 7/15:   Antimicrobials:    Interim history/subjective:  Feels better.   She is in no distress  Objective   Blood pressure (Abnormal) 148/83, pulse 89, temperature 98.5 F (36.9 C), temperature source Oral, resp. rate 20, height 5\' 6"  (1.676 m), weight 126.6 kg, SpO2 97 %.        Intake/Output Summary (Last 24 hours) at 01/13/2020 1004 Last data filed at 01/13/2020 0630 Gross per 24 hour  Intake 200 ml  Output 2300 ml  Net -2100 ml   Filed Weights   01/11/20 2337 01/12/20 1003 01/13/20 0500  Weight: 131.5 kg 130.4 kg 126.6 kg    Examination: General Pleasant 49 year old female sitting up in bed resting comfortably able to speak in full sentences no dyspnea HEENT normocephalic atraumatic no jugular venous distention mucous membranes are moist sclera nonicteric Pulmonary clear to auscultation improved aeration bilaterally Cardiac regular rate and rhythm Abdomen soft nontender Extremities are warm and dry Neuro intact  Resolved Hospital Problem list     Assessment & Plan:   Dyspnea  Large right exudative pleural effusion (r/o malignant effusion) Right lower lobe atelectasis  LE swelling Atrial fib abd/Pelvic mass  Multifactorial dyspnea Multifactorial: large right effusion w/ associated consolidated RLL (favor atelectasis but mass not excluded). Also has element of what looks like pulmonary edema.  -breathing easier -pleural fluid c/w exudate. Given the Port wine appearance and also the mass identified in abd/pelvis I am very concerned that this is a malignant effusion.  -f/u CT scan shows findings c/w segmental and subsegmental PE in Bilateral Lower lobes but improved effusion and aeration on the right. No lung mass. Again abd mass noted.  Plan Would go ahead and start systemic AC given f/u findings of CT angio as well as possible malignancy and atrial fib (the pleural fluid was blood tinged but more reflective of inflammatory  process, not hemothorax or active bleeding) F/u cytology Needs further workup of abd/pelvis mass We will see her in  the office (she is leaving out of town for court hearing but will get her to see [place] in office as soon as she returns) will need cxr. If fluid malignant and effusion returns we may need to place Pleurx   Simonne Martinet ACNP-BC [facility] Pulmonary/Critical Care Pager # [phone] OR # [phone] if no answer     Best practice:  Per primary   Labs   CBC: Recent Labs  Lab 01/11/20 2354 01/13/20 0425  WBC 12.0* 12.6*  HGB 10.1* 10.0*  HCT 34.4* 35.5*  MCV 74.9* 75.2*  PLT 224 199    Basic Metabolic Panel: Recent Labs  Lab 01/11/20 2354 01/12/20 0625 01/13/20 0425  NA 140  --  139  K 3.7  --  4.0  CL 105  --  103  CO2 23  --  23  GLUCOSE 113*  --  111*  BUN 16  --  16  CREATININE 0.89  --  0.87  CALCIUM 9.3  --  9.2  MG  --  2.1  --    GFR: Estimated Creatinine Clearance: 104.1 mL/min (by C-G formula based on SCr of 0.87 mg/dL). Recent Labs  Lab 01/11/20 2354 01/12/20 0625 01/13/20 0425  PROCALCITON  --  <0.10  --   WBC 12.0*  --  12.6*    Liver Function Tests: Recent Labs  Lab 01/12/20 0625 01/12/20 1347  AST 21  --   ALT 14  --   ALKPHOS 46  --   BILITOT 0.5  --   PROT 8.0 8.3*  ALBUMIN 3.9  --    Recent Labs  Lab 01/11/20 2354  LIPASE 30   No results for input(s): AMMONIA in the last 168 hours.  ABG No results found for: PHART, PCO2ART, PO2ART, HCO3, TCO2, ACIDBASEDEF, O2SAT   Coagulation Profile: Recent Labs  Lab 01/12/20 0625  INR 1.5*    Cardiac Enzymes: No results for input(s): CKTOTAL, CKMB, CKMBINDEX, TROPONINI in the last 168 hours.  HbA1C: No results found for: HGBA1C  CBG: No results for input(s): GLUCAP in the last 168 hours. Simonne Martinet ACNP-BC [facility] Pulmonary/Critical Care Pager # [phone] OR # [phone] if no answer

## 2020-01-15 LAB — CHOLESTEROL, BODY FLUID: Cholesterol, Fluid: 140 mg/dL

## 2020-01-16 LAB — BODY FLUID CULTURE: Culture: NO GROWTH

## 2020-01-16 LAB — CYTOLOGY - NON PAP

## 2020-01-17 ENCOUNTER — Telehealth: Payer: Self-pay | Admitting: Adult Health

## 2020-01-18 LAB — MISC LABCORP TEST (SEND OUT): Labcorp test code: 830893

## 2020-01-18 NOTE — Telephone Encounter (Signed)
I am unable to reach her and left a voice mail requesting call back to discuss results.

## 2020-01-18 NOTE — Telephone Encounter (Signed)
Patient seen in Hospital by Dr. Isaiah Serge . Will send to him for review .

## 2020-01-18 NOTE — Telephone Encounter (Signed)
Called they RP states they are available now or tomorrow to talk to Dr. Isaiah Serge

## 2020-01-19 NOTE — Telephone Encounter (Signed)
I called and discussed results with son, Katy Brickell .  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 He is trying to find more information and will inform us when he knows more about what happened.  Please cancel her upcoming clinic appointment  Chilton Greathouse MD [facility] 01/19/2020, 8:20 AM

## 2020-01-19 NOTE — Telephone Encounter (Signed)
I have cancelled the appt.

## 2020-01-21 ENCOUNTER — Telehealth: Payer: Self-pay | Admitting: Internal Medicine

## 2020-01-21 NOTE — Telephone Encounter (Signed)
Discussed Natalie Manning findings of fluid in right pleural space when hospitalized with dyspnea recently.  Was found to Likely have PEs bilaterally and started on Eliquis and a RUQ mass.  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   Discussed the plan was for her to return to [facility] to complete evaluation and follow up. They were concerned something untoward may have happened to her in [place], but had been unaware of her hospitalization and specific findings other than cancer found in pleural fluid and just wanted more information about what was found and what her symptoms were.   She apparently had not shared any of this information with family upon returning to [place]

## 2020-01-27 ENCOUNTER — Ambulatory Visit (HOSPITAL_COMMUNITY): Payer: Self-pay | Admitting: Physician Assistant

## 2020-01-29 NOTE — Telephone Encounter (Signed)
Natalie Manning, patient calling for results of recent tap. Please advise.   - Malignant cells consistent with adenocarcinoma

## 2020-01-29 DEATH — deceased

## 2020-02-06 ENCOUNTER — Inpatient Hospital Stay: Payer: Self-pay | Admitting: Adult Health

## 2020-02-09 ENCOUNTER — Ambulatory Visit: Payer: Self-pay | Admitting: Cardiology

## 2020-03-20 ENCOUNTER — Ambulatory Visit: Payer: Self-pay | Admitting: Internal Medicine

## 2021-12-02 IMAGING — CT CT ANGIO CHEST
2 of 6 series · 15 of 46 positions shown · IV contrast (APPLIED)
Comparison: January 12, 2020
COMPARISON: January 12, 2020

Addendum:
CLINICAL DATA: Shortness of breath.

EXAM:
CT ANGIOGRAPHY CHEST WITH CONTRAST
TECHNIQUE: Multidetector CT imaging of the chest was performed using the
standard protocol during bolus administration of intravenous
contrast. Multiplanar CT image reconstructions and MIPs were
obtained to evaluate the vascular anatomy.
CONTRAST:  100mL OMNIPAQUE IOHEXOL 350 MG/ML SOLN

[Series 5: thins · axial · 0.71mm/px · z∈[-156,+89]mm · 13 of 269 slices shown]
[im 12/269  lung]
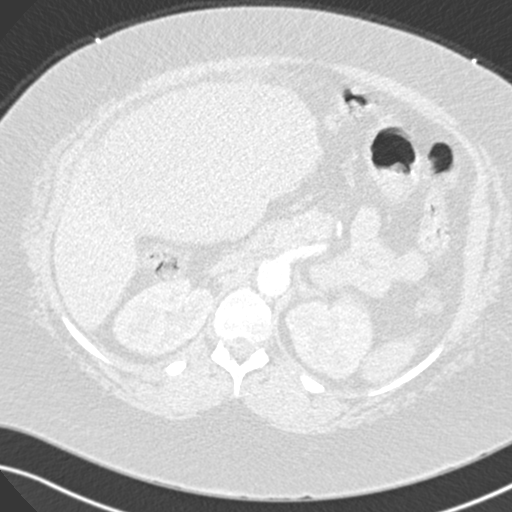
[im 35/269  soft-tissue]
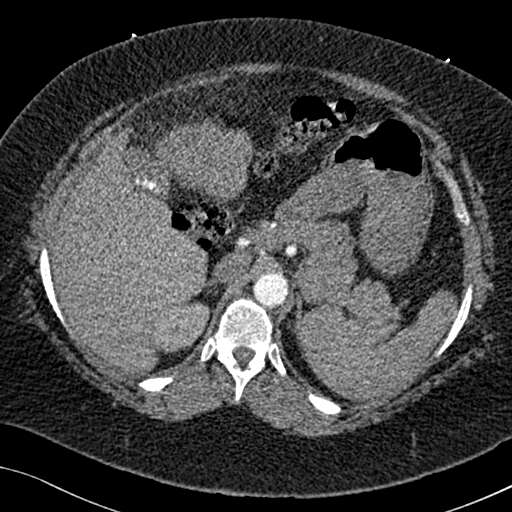
[im 59/269  lung]
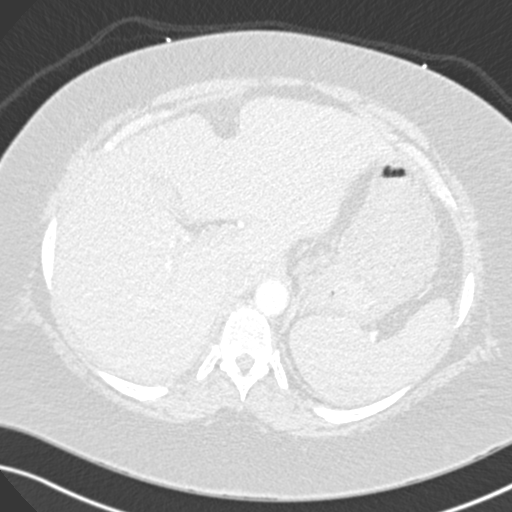
[im 70/269  soft-tissue]
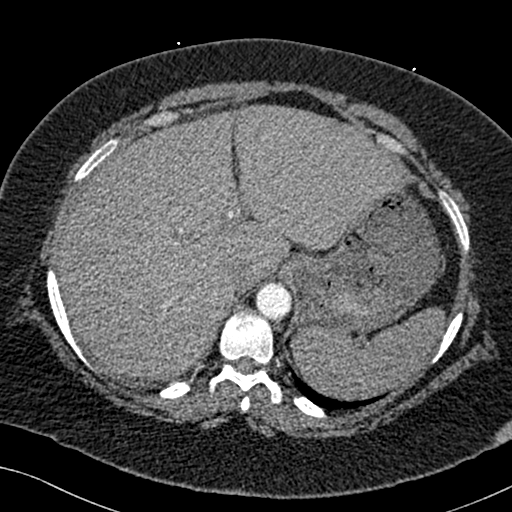
[im 94/269  lung]
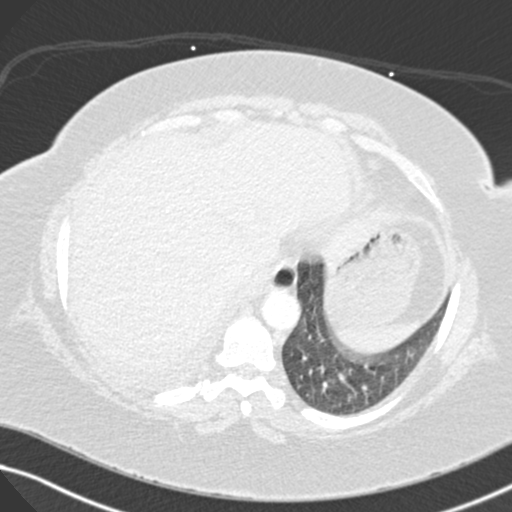
[im 117/269  soft-tissue]
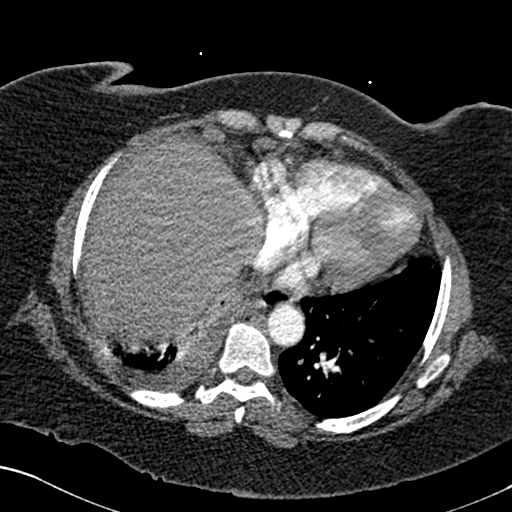
[im 140/269  lung]
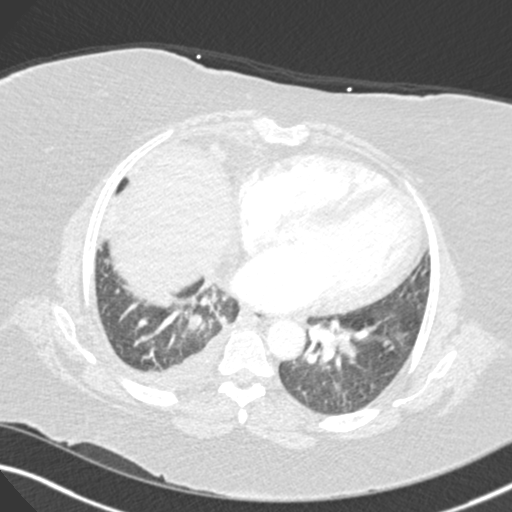
[im 152/269  soft-tissue]
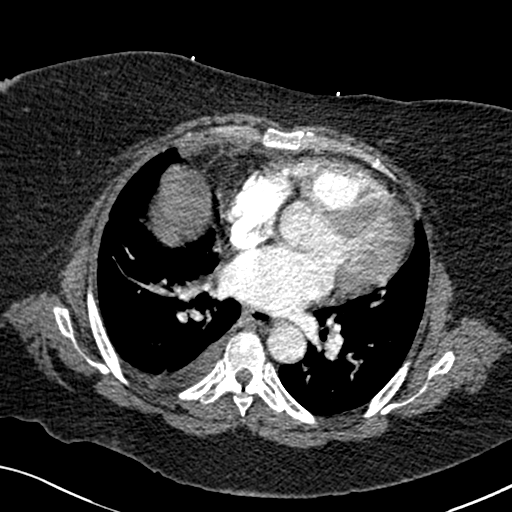
[im 175/269  lung]
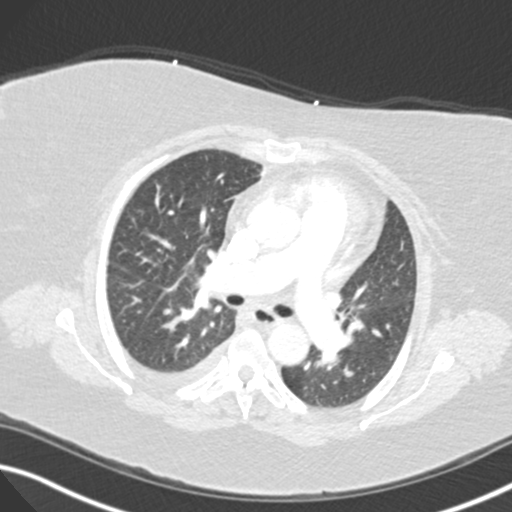
[im 199/269  soft-tissue]
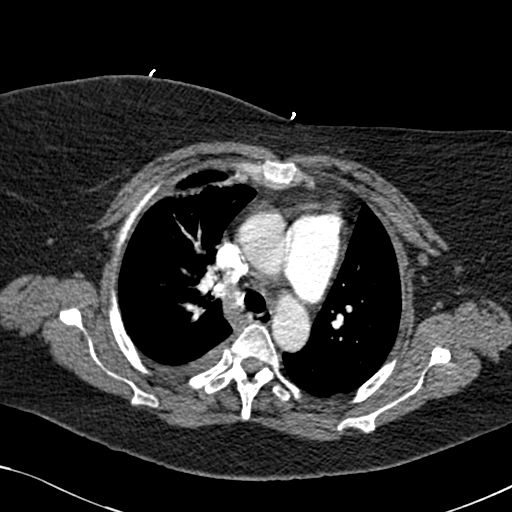
[im 210/269  lung]
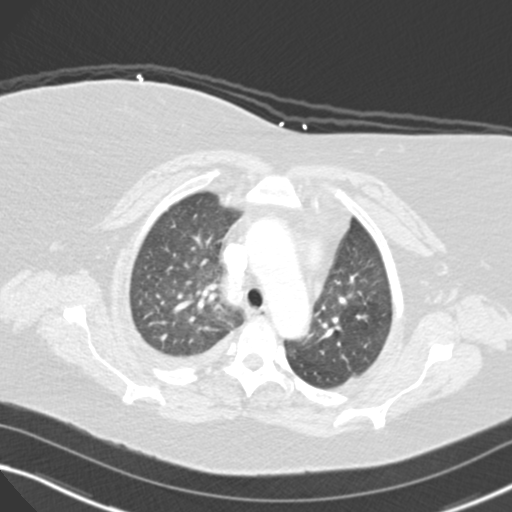
[im 234/269  soft-tissue]
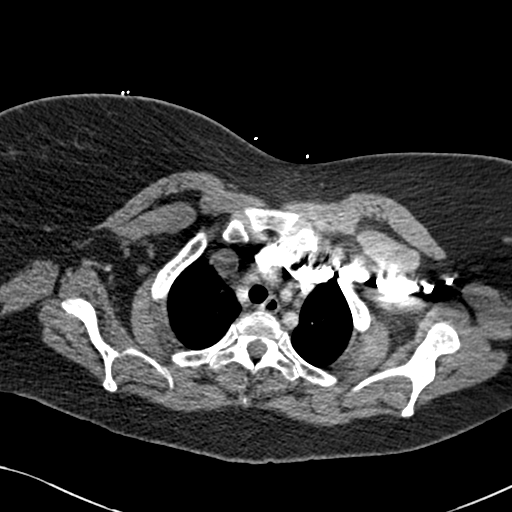
[im 257/269  lung]
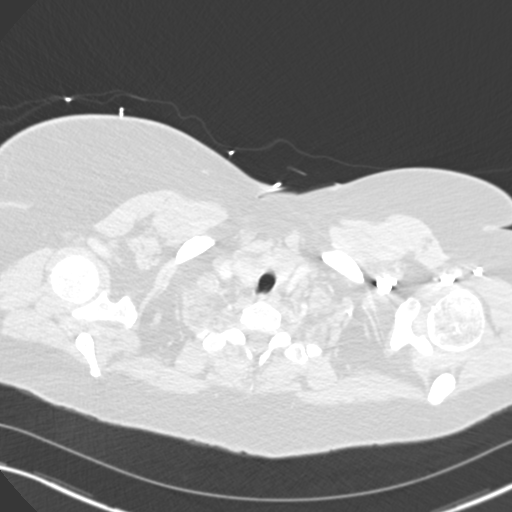

[Series 7: coronal mpr · coronal · 0.53mm/px · 2 of 93 slices shown]
[im 31/93  soft-tissue]
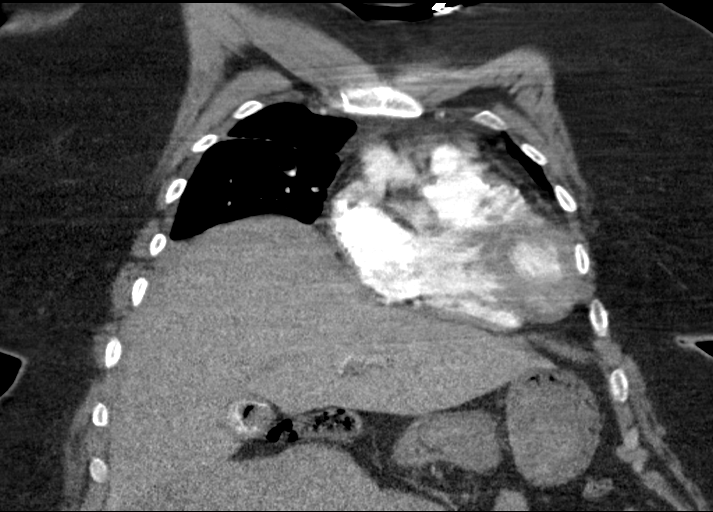
[im 62/93  soft-tissue]
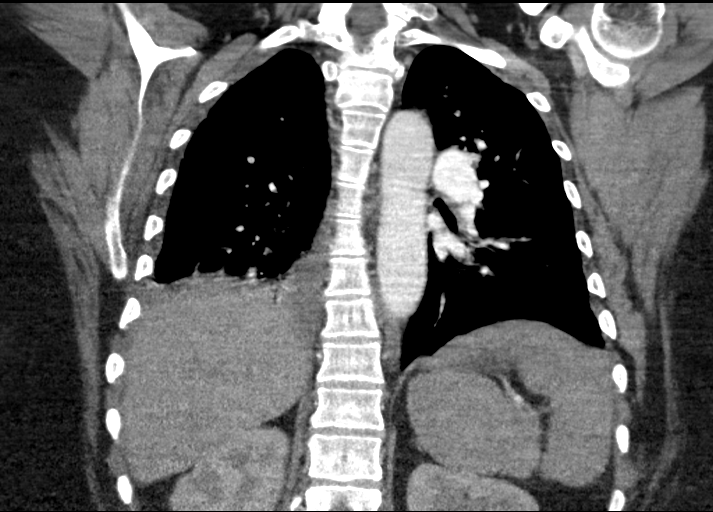

[15 of 46 positions shown; findings below may reference images not displayed]

FINDINGS: Cardiovascular: Evaluation is significantly limited by respiratory
motion artifact and patient body habitus. There is no large
centrally located pulmonary embolism. Findings are suspicious for
segmental and subsegmental pulmonary emboli involving the bilateral
lower lobes, perhaps most evident in the right lower lobe (axial
series 5, image 131). The heart size is mildly enlarged. There is no
evidence for thoracic aortic aneurysm or dissection. There is no
large pericardial effusion.

Mediastinum/Nodes:

--there are no pathologically enlarged mediastinal lymph nodes.

-- No hilar lymphadenopathy.

-- No axillary lymphadenopathy.

-- No supraclavicular lymphadenopathy.

-- Normal thyroid gland where visualized.

-  Unremarkable esophagus.

Lungs/Pleura: The right-sided pleural effusion has significantly
decreased in size since the prior study. There is a small residual
right-sided pleural effusion. There is no pneumothorax. There is
some atelectasis at the right lung base. The trachea is
unremarkable.

Upper Abdomen: Contrast bolus timing is not optimized for evaluation
of the abdominal organs. There is a partially visualized mass
extending into the right upper quadrant measuring at least 19.2 x
12.1 cm. There is infiltration of the omentum in the right upper
quadrant. There is cholelithiasis without definite CT evidence for
acute cholecystitis. There are enlarged pre pericardial and
parasternal lymph nodes measuring up to approximately 1.1 cm in the
short axis (axial series 4, image 50). There is a small amount of
free fluid in the upper abdomen.

Musculoskeletal: No chest wall abnormality. No bony spinal canal
stenosis.

Review of the MIP images confirms the above findings.
IMPRESSION: 1. Evaluation is again severely limited by patient body habitus and
respiratory motion artifact. There is no large centrally located
pulmonary embolism. However, findings are again concerning for
segmental and subsegmental pulmonary emboli as detailed above.
2. Large, partially visualized mass in the upper abdomen. Further
evaluation with a contrast enhanced CT of the abdomen and pelvis is
recommended.
3. Significant interval decrease in the previously demonstrated
right-sided pleural effusion. There is a small residual right-sided
pleural effusion with adjacent atelectasis.
4. Trace abdominal ascites.
5. There is cholelithiasis without secondary signs of acute
cholecystitis.
6. Mild cardiomegaly.
7. Enlarged pericardial lymph nodes of unknown clinical
significance.

ADDENDUM:
These results will be called to the ordering clinician or
representative by the Radiologist Assistant, and communication
documented in the PACS or [REDACTED].

*** End of Addendum ***
FINDINGS: Cardiovascular: Evaluation is significantly limited by respiratory
motion artifact and patient body habitus. There is no large
centrally located pulmonary embolism. Findings are suspicious for
segmental and subsegmental pulmonary emboli involving the bilateral
lower lobes, perhaps most evident in the right lower lobe (axial
series 5, image 131). The heart size is mildly enlarged. There is no
evidence for thoracic aortic aneurysm or dissection. There is no
large pericardial effusion.

Mediastinum/Nodes:

--there are no pathologically enlarged mediastinal lymph nodes.

-- No hilar lymphadenopathy.

-- No axillary lymphadenopathy.

-- No supraclavicular lymphadenopathy.

-- Normal thyroid gland where visualized.

-  Unremarkable esophagus.

Lungs/Pleura: The right-sided pleural effusion has significantly
decreased in size since the prior study. There is a small residual
right-sided pleural effusion. There is no pneumothorax. There is
some atelectasis at the right lung base. The trachea is
unremarkable.

Upper Abdomen: Contrast bolus timing is not optimized for evaluation
of the abdominal organs. There is a partially visualized mass
extending into the right upper quadrant measuring at least 19.2 x
12.1 cm. There is infiltration of the omentum in the right upper
quadrant. There is cholelithiasis without definite CT evidence for
acute cholecystitis. There are enlarged pre pericardial and
parasternal lymph nodes measuring up to approximately 1.1 cm in the
short axis (axial series 4, image 50). There is a small amount of
free fluid in the upper abdomen.

Musculoskeletal: No chest wall abnormality. No bony spinal canal
stenosis.

Review of the MIP images confirms the above findings.
IMPRESSION: 1. Evaluation is again severely limited by patient body habitus and
respiratory motion artifact. There is no large centrally located
pulmonary embolism. However, findings are again concerning for
segmental and subsegmental pulmonary emboli as detailed above.
2. Large, partially visualized mass in the upper abdomen. Further
evaluation with a contrast enhanced CT of the abdomen and pelvis is
recommended.
3. Significant interval decrease in the previously demonstrated
right-sided pleural effusion. There is a small residual right-sided
pleural effusion with adjacent atelectasis.
4. Trace abdominal ascites.
5. There is cholelithiasis without secondary signs of acute
cholecystitis.
6. Mild cardiomegaly.
7. Enlarged pericardial lymph nodes of unknown clinical
significance.
